# Patient Record
Sex: Female | Born: 1960 | Race: White | Hispanic: No | Marital: Married | Smoking: Never smoker
Health system: Southern US, Community
[De-identification: ages and names within clinical notes are randomized; demographics above are authoritative.]

## PROBLEM LIST (undated history)

## (undated) DIAGNOSIS — M1990 Unspecified osteoarthritis, unspecified site: Secondary | ICD-10-CM

## (undated) DIAGNOSIS — F32A Depression, unspecified: Secondary | ICD-10-CM

## (undated) DIAGNOSIS — F329 Major depressive disorder, single episode, unspecified: Secondary | ICD-10-CM

## (undated) DIAGNOSIS — J309 Allergic rhinitis, unspecified: Secondary | ICD-10-CM

## (undated) DIAGNOSIS — T7840XA Allergy, unspecified, initial encounter: Secondary | ICD-10-CM

## (undated) DIAGNOSIS — F419 Anxiety disorder, unspecified: Secondary | ICD-10-CM

## (undated) DIAGNOSIS — M6281 Muscle weakness (generalized): Secondary | ICD-10-CM

## (undated) DIAGNOSIS — J302 Other seasonal allergic rhinitis: Secondary | ICD-10-CM

## (undated) DIAGNOSIS — R519 Headache, unspecified: Secondary | ICD-10-CM

## (undated) DIAGNOSIS — I10 Essential (primary) hypertension: Secondary | ICD-10-CM

## (undated) DIAGNOSIS — G4700 Insomnia, unspecified: Secondary | ICD-10-CM

## (undated) DIAGNOSIS — K449 Diaphragmatic hernia without obstruction or gangrene: Secondary | ICD-10-CM

## (undated) DIAGNOSIS — D649 Anemia, unspecified: Secondary | ICD-10-CM

## (undated) DIAGNOSIS — K219 Gastro-esophageal reflux disease without esophagitis: Secondary | ICD-10-CM

## (undated) DIAGNOSIS — K224 Dyskinesia of esophagus: Secondary | ICD-10-CM

## (undated) DIAGNOSIS — D131 Benign neoplasm of stomach: Secondary | ICD-10-CM

## (undated) HISTORY — DX: Essential (primary) hypertension: I10

## (undated) HISTORY — DX: Allergic rhinitis, unspecified: J30.9

## (undated) HISTORY — DX: Allergy, unspecified, initial encounter: T78.40XA

## (undated) HISTORY — DX: Dyskinesia of esophagus: K22.4

## (undated) HISTORY — DX: Anxiety disorder, unspecified: F41.9

## (undated) HISTORY — DX: Unspecified osteoarthritis, unspecified site: M19.90

## (undated) HISTORY — PX: WISDOM TOOTH EXTRACTION: SHX21

## (undated) HISTORY — DX: Diaphragmatic hernia without obstruction or gangrene: K44.9

## (undated) HISTORY — PX: UPPER GI ENDOSCOPY: SHX6162

## (undated) HISTORY — DX: Muscle weakness (generalized): M62.81

## (undated) HISTORY — PX: UPPER GASTROINTESTINAL ENDOSCOPY: SHX188

## (undated) HISTORY — DX: Gastro-esophageal reflux disease without esophagitis: K21.9

## (undated) HISTORY — PX: COLONOSCOPY: SHX174

---

## 1999-01-22 ENCOUNTER — Other Ambulatory Visit: Admission: RE | Admit: 1999-01-22 | Discharge: 1999-01-22 | Payer: Self-pay | Admitting: Obstetrics & Gynecology

## 2000-01-20 ENCOUNTER — Other Ambulatory Visit: Admission: RE | Admit: 2000-01-20 | Discharge: 2000-01-20 | Payer: Self-pay | Admitting: Gynecology

## 2002-03-06 ENCOUNTER — Other Ambulatory Visit: Admission: RE | Admit: 2002-03-06 | Discharge: 2002-03-06 | Payer: Self-pay | Admitting: Gynecology

## 2003-03-08 ENCOUNTER — Other Ambulatory Visit: Admission: RE | Admit: 2003-03-08 | Discharge: 2003-03-08 | Payer: Self-pay | Admitting: Gynecology

## 2003-06-04 ENCOUNTER — Ambulatory Visit (HOSPITAL_COMMUNITY): Admission: RE | Admit: 2003-06-04 | Discharge: 2003-06-04 | Payer: Self-pay | Admitting: Pulmonary Disease

## 2005-04-23 ENCOUNTER — Other Ambulatory Visit: Admission: RE | Admit: 2005-04-23 | Discharge: 2005-04-23 | Payer: Self-pay | Admitting: Gynecology

## 2011-05-15 ENCOUNTER — Ambulatory Visit (INDEPENDENT_AMBULATORY_CARE_PROVIDER_SITE_OTHER): Payer: 59 | Admitting: Gastroenterology

## 2011-05-15 ENCOUNTER — Encounter: Payer: Self-pay | Admitting: Gastroenterology

## 2011-05-15 VITALS — BP 130/90 | HR 68 | Ht 62.0 in | Wt 138.4 lb

## 2011-05-15 DIAGNOSIS — I7300 Raynaud's syndrome without gangrene: Secondary | ICD-10-CM | POA: Insufficient documentation

## 2011-05-15 DIAGNOSIS — R072 Precordial pain: Secondary | ICD-10-CM

## 2011-05-15 DIAGNOSIS — K219 Gastro-esophageal reflux disease without esophagitis: Secondary | ICD-10-CM | POA: Insufficient documentation

## 2011-05-15 DIAGNOSIS — R0789 Other chest pain: Secondary | ICD-10-CM | POA: Insufficient documentation

## 2011-05-15 MED ORDER — DEXLANSOPRAZOLE 60 MG PO CPDR: 60.0000 mg | DELAYED_RELEASE_CAPSULE | Freq: Every day | ORAL | Status: DC

## 2011-05-15 NOTE — Patient Instructions (Addendum)
Your procedure has been scheduled for 05/20/2011, please follow the seperate instructions.  Your prescription(s) have been sent to you pharmacy.  Today you watched the [employer].       Gastroesophageal Reflux Disease (GERD) Your caregiver has diagnosed your chest discomfort as caused by gastroesophageal reflux disease (GERD). GERD is caused by a reflux of acid from your stomach into the digestive tube between your mouth and stomach (esophagus). Acid in contact with the esophagus causes soreness (inflammation) resulting in heartburn or chest pain. It may cause small holes in the lining of the esophagus (ulcers). CAUSES  Increased body weight puts pressure on the stomach, making acid rise.   Smoking increases acid production.   Alcohol decreases pressure on the valve between the stomach and esophagus (lower esophageal sphincter), allowing acid from the stomach into the esophagus.   Late evening meals and a full stomach increase pressure and acid production.   Lower esophageal sphincter is malformed.   Sometimes, no reason is found.  HOME CARE INSTRUCTIONS  Change the factors that you can control. Weight, smoking, or alcohol changes may be difficult to change on your own. Your caregiver can provide guidance and medical therapy.   Raising the head of your bed may help you to sleep.   Over-the-counter medicines will decrease acid production. Your caregiver can also prescribe medicines for this. Only take over-the-counter or prescription medicines for pain, discomfort, or fever as directed by your caregiver.   1/2 to 1 teaspoon of an antacid taken every hour while awake, with meals, and at bedtime, can neutralize acid.   DO NOT take aspirin, ibuprofen, or other nonsteroidal anti-inflammatory drugs (NSAIDs).  SEEK IMMEDIATE MEDICAL CARE IF:  The pain changes in location (radiates into arms, neck, jaw, teeth, or back), intensity, or duration.   You start feeling sick to your stomach  (nauseous), start throwing up (vomiting), or sweating (diaphoresis).   You develop left arm or jaw pain.   You develop pain going into your back, shortness of breath, or you pass out.   There is vomiting of fluid that is green, yellow, or looks like coffee grounds or blood.  These symptoms could signal other problems, such as heart disease. MAKE SURE YOU:  Understand these instructions.   Will watch your condition.   Will get help right away if you are not doing well or get worse.  Document Released: 09/09/2005 Document Re-Released: 02/24/2010 [facility] Patient Information 2011 [place], [place].

## 2011-05-15 NOTE — Progress Notes (Signed)
History of Present Illness:  This is a extremely 50 year old Caucasian female referred for evaluation of years of reflux symptoms consisting of regurgitation and burning substernal chest pain, worse in the morning, and partially relieved by over-the-counter Prilosec. She has a globus sensation her throat, but denies true dysphagia. She has voluntarily lost 20 pounds in weight over the last year with some improvement in her chronic symptoms. She denies hepatobiliary complaints, change in bowel habits, melena or hematochezia. She otherwise is healthy, but does have Raynaud's phenomenon in her hands and feet. She is on birth control pills, does not have menstrual periods, but is followed closely by her gynecologist. She has not had previous endoscopy or barium x-rays. Family history is remarkable for chronic acid reflux in her mother and brother.  I have reviewed this patient's present history, medical and surgical past history, allergies and medications.     ROS: The remainder of the 10 point ROS is negative... she does have hormonal induced headaches, allergic sinusitis, and chronic insomnia. She denies any cardiovascular pulmonary or genitourinary symptoms. She has not had previous surgical procedures.   Past Medical History  Diagnosis Date  . Chronic headaches    Past Surgical History  Procedure Date  . Wisdom tooth extraction     reports that she has never smoked. She has never used smokeless tobacco. She reports that she does not drink alcohol or use illicit drugs. family history includes Hypertension in her mother and Thyroid cancer in her mother. No Known Allergies     Physical Exam: General well developed well nourished patient in no acute distress, appearing her stated age Eyes PERRLA, no icterus, fundoscopic exam per opthamologist Skin no lesions noted Neck supple, no adenopathy, no thyroid enlargement, no tenderness. Examination of the oropharynx also is unremarkable. Chest clear  to percussion and auscultation Heart no significant murmurs, gallops or rubs noted Abdomen no hepatosplenomegaly masses or tenderness, BS normal.  Extremities no acute joint lesions, edema, phlebitis or evidence of cellulitis. She does have very cold digits but no evidence of cyanosis at this time. Neurologic patient oriented x 3, cranial nerves intact, no focal neurologic deficits noted. Psychological mental status normal and normal affect.  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 I have changed her to Dexilant 60 mg 30 minutes before the evening meal with standard anti-reflex maneuvers. She saw our patient video acid reflux management. She is to eat 3 meals a day but to avoid food within 2 hours of bedtime. Outpatient endoscopy scheduled to exclude Barrett's mucosa. She may need esophageal manometry to confirm possible motility disorder dependent on her clinical course and management. \ Please copy her primary care physician, referring physician, and pertinent subspecialists.  Encounter Diagnosis  Name Primary?  . Esophageal reflux Yes

## 2011-05-20 ENCOUNTER — Telehealth: Payer: Self-pay | Admitting: *Deleted

## 2011-05-20 ENCOUNTER — Ambulatory Visit (AMBULATORY_SURGERY_CENTER): Payer: 59 | Admitting: Gastroenterology

## 2011-05-20 ENCOUNTER — Encounter: Payer: Self-pay | Admitting: Gastroenterology

## 2011-05-20 VITALS — BP 147/79 | HR 74 | Temp 99.1°F | Resp 18 | Ht 62.0 in | Wt 138.0 lb

## 2011-05-20 DIAGNOSIS — K219 Gastro-esophageal reflux disease without esophagitis: Secondary | ICD-10-CM

## 2011-05-20 MED ORDER — SODIUM CHLORIDE 0.9 % IV SOLN: 500.0000 mL | INTRAVENOUS | Status: DC

## 2011-05-20 NOTE — Patient Instructions (Signed)
Discharge instructions given with verbal understanding. Handout on hiatal hernia and gerd given. Soft diet given. Resume previous medications.

## 2011-05-21 ENCOUNTER — Encounter: Payer: Self-pay | Admitting: *Deleted

## 2011-05-21 ENCOUNTER — Telehealth: Payer: Self-pay

## 2011-05-21 NOTE — Telephone Encounter (Signed)
Lmom for pt explaining I scheduled her for a f/u with Dr Jarold Motto for 06/22/11 at 0845am. I will send her a letter informing her of this.

## 2011-05-21 NOTE — Telephone Encounter (Signed)
No ID on answering machine. 

## 2011-06-10 ENCOUNTER — Encounter: Payer: Self-pay | Admitting: *Deleted

## 2011-06-22 ENCOUNTER — Ambulatory Visit (INDEPENDENT_AMBULATORY_CARE_PROVIDER_SITE_OTHER): Payer: 59 | Admitting: Gastroenterology

## 2011-06-22 VITALS — BP 118/76 | HR 88 | Ht 62.0 in | Wt 140.0 lb

## 2011-06-22 DIAGNOSIS — K219 Gastro-esophageal reflux disease without esophagitis: Secondary | ICD-10-CM

## 2011-06-22 MED ORDER — PANTOPRAZOLE SODIUM 40 MG PO TBEC: 40.0000 mg | DELAYED_RELEASE_TABLET | Freq: Every day | ORAL | Status: DC

## 2011-06-22 NOTE — Patient Instructions (Addendum)
We have sent in Protonix to your pharmacy, per our computer it is the only reflux medication that is covered by your insurance.  Buy Caltrate 600 with Vit D OTC and take one by mouth twice a day

## 2011-06-22 NOTE — Progress Notes (Signed)
History of Present Illness: This is a 50 year old Caucasian female with Raynaud's phenomenon and chronic GERD. Recent endoscopy was otherwise unremarkable. She is currently asymptomatic on Dexilant 60 mg a day and standard reflux maneuvers. There is no history of other gastrointestinal or general medical problems. She is currently being evaluated by GYN for menopausal symptoms.  Current Medications, Allergies, Past Medical History, Past Surgical History, Family History and Social History were reviewed in [employer] record.   Assessment and plan: Doing well all her current regime. I prescribed a cheaper PPI,and we will see how she does symptomatically. When necessary followup suggested. She will need colonoscopy at age 66. I suggested Caltrate 600 with vitamin D, 2 a day for prophylaxis against osteoporosis with long-term PPI use.  Please copy her primary care physician, referring physician, and pertinent subspecialists.

## 2012-02-08 ENCOUNTER — Telehealth: Payer: Self-pay | Admitting: Gastroenterology

## 2012-02-09 MED ORDER — DEXLANSOPRAZOLE 60 MG PO CPDR: DELAYED_RELEASE_CAPSULE | ORAL | Status: DC

## 2012-02-09 NOTE — Telephone Encounter (Signed)
Pt with hx of Chronic GERD, Raynaud's; last OV 06/22/11. She was placed on Dexilant, but it was too expensive and she switched to Pantoprazole. Today, she reports the Protonix isn't strong enough and wants another PPI. She requested Dexilant and it was ordered

## 2012-05-24 ENCOUNTER — Other Ambulatory Visit (HOSPITAL_COMMUNITY): Payer: Self-pay | Admitting: Family Medicine

## 2012-05-24 DIAGNOSIS — Z139 Encounter for screening, unspecified: Secondary | ICD-10-CM

## 2012-05-27 ENCOUNTER — Ambulatory Visit (HOSPITAL_COMMUNITY)
Admission: RE | Admit: 2012-05-27 | Discharge: 2012-05-27 | Disposition: A | Discharge status: Home / Self Care | Payer: 59 | Source: Ambulatory Visit | Attending: Family Medicine | Admitting: Family Medicine

## 2012-05-27 DIAGNOSIS — Z13820 Encounter for screening for osteoporosis: Secondary | ICD-10-CM | POA: Insufficient documentation

## 2012-05-27 DIAGNOSIS — Z139 Encounter for screening, unspecified: Secondary | ICD-10-CM

## 2012-07-22 ENCOUNTER — Other Ambulatory Visit: Payer: Self-pay | Admitting: Obstetrics and Gynecology

## 2012-07-22 DIAGNOSIS — Z1231 Encounter for screening mammogram for malignant neoplasm of breast: Secondary | ICD-10-CM

## 2012-08-17 ENCOUNTER — Ambulatory Visit
Admission: RE | Admit: 2012-08-17 | Discharge: 2012-08-17 | Disposition: A | Discharge status: Home / Self Care | Payer: 59 | Source: Ambulatory Visit | Attending: Obstetrics and Gynecology | Admitting: Obstetrics and Gynecology

## 2012-08-17 DIAGNOSIS — Z1231 Encounter for screening mammogram for malignant neoplasm of breast: Secondary | ICD-10-CM

## 2012-09-28 ENCOUNTER — Other Ambulatory Visit: Payer: Self-pay | Admitting: Gastroenterology

## 2012-12-29 ENCOUNTER — Encounter: Payer: Self-pay | Admitting: Gastroenterology

## 2013-01-04 ENCOUNTER — Ambulatory Visit (AMBULATORY_SURGERY_CENTER): Payer: 59 | Admitting: *Deleted

## 2013-01-04 ENCOUNTER — Encounter: Payer: Self-pay | Admitting: Gastroenterology

## 2013-01-04 VITALS — Ht 62.0 in | Wt 143.0 lb

## 2013-01-04 DIAGNOSIS — Z1211 Encounter for screening for malignant neoplasm of colon: Secondary | ICD-10-CM

## 2013-01-04 MED ORDER — MOVIPREP 100 G PO SOLR: ORAL | Status: DC

## 2013-01-23 ENCOUNTER — Encounter: Payer: Self-pay | Admitting: Gastroenterology

## 2013-01-23 ENCOUNTER — Ambulatory Visit (AMBULATORY_SURGERY_CENTER): Payer: 59 | Admitting: Gastroenterology

## 2013-01-23 VITALS — BP 142/73 | HR 62 | Temp 98.5°F | Resp 15 | Ht 62.0 in | Wt 143.0 lb

## 2013-01-23 DIAGNOSIS — Z1211 Encounter for screening for malignant neoplasm of colon: Secondary | ICD-10-CM

## 2013-01-23 MED ORDER — SODIUM CHLORIDE 0.9 % IV SOLN: 500.0000 mL | INTRAVENOUS | Status: DC

## 2013-01-23 NOTE — Patient Instructions (Addendum)
YOU HAD AN ENDOSCOPIC PROCEDURE TODAY AT THE  ENDOSCOPY CENTER: Refer to the procedure report that was given to you for any specific questions about what was found during the examination.  If the procedure report does not answer your questions, please call your gastroenterologist to clarify.  If you requested that your care partner not be given the details of your procedure findings, then the procedure report has been included in a sealed envelope for you to review at your convenience later.  YOU SHOULD EXPECT: Some feelings of bloating in the abdomen. Passage of more gas than usual.  Walking can help get rid of the air that was put into your GI tract during the procedure and reduce the bloating. If you had a lower endoscopy (such as a colonoscopy or flexible sigmoidoscopy) you may notice spotting of blood in your stool or on the toilet paper. If you underwent a bowel prep for your procedure, then you may not have a normal bowel movement for a few days.  DIET: Your first meal following the procedure should be a light meal and then it is ok to progress to your normal diet.  A half-sandwich or bowl of soup is an example of a good first meal.  Heavy or fried foods are harder to digest and may make you feel nauseous or bloated.  Likewise meals heavy in dairy and vegetables can cause extra gas to form and this can also increase the bloating.  Drink plenty of fluids but you should avoid alcoholic beverages for 24 hours.  Use a tablespoon of metamucil every morning after breakfast per Dr. Jarold Motto.  ACTIVITY: Your care partner should take you home directly after the procedure.  You should plan to take it easy, moving slowly for the rest of the day.  You can resume normal activity the day after the procedure however you should NOT DRIVE or use heavy machinery for 24 hours (because of the sedation medicines used during the test).    SYMPTOMS TO REPORT IMMEDIATELY: A gastroenterologist can be reached at any  hour.  During normal business hours, 8:30 AM to 5:00 PM Monday through Friday, call [phone].  After hours and on weekends, please call the GI answering service at [phone] who will take a message and have the physician on call contact you.   Following lower endoscopy (colonoscopy or flexible sigmoidoscopy):  Excessive amounts of blood in the stool  Significant tenderness or worsening of abdominal pains  Swelling of the abdomen that is new, acute  Fever of 100F or higher  FOLLOW UP: If any biopsies were taken you will be contacted by phone or by letter within the next 1-3 weeks.  Call your gastroenterologist if you have not heard about the biopsies in 3 weeks.  Our staff will call the home number listed on your records the next business day following your procedure to check on you and address any questions or concerns that you may have at that time regarding the information given to you following your procedure. This is a courtesy call and so if there is no answer at the home number and we have not heard from you through the emergency physician on call, we will assume that you have returned to your regular daily activities without incident.  SIGNATURES/CONFIDENTIALITY: You and/or your care partner have signed paperwork which will be entered into your electronic medical record.  These signatures attest to the fact that that the information above on your After Visit Summary has  been reviewed and is understood.  Full responsibility of the confidentiality of this discharge information lies with you and/or your care-partner.  Thank-you for choosing [place] for your healthcare needs.

## 2013-01-23 NOTE — Op Note (Signed)
[facility] [address] [place], [address]   COLONOSCOPY PROCEDURE REPORT  PATIENT: Carla Massey, Carla Massey  MR#: [id number] BIRTHDATE: [birth year] , 52  yrs. old GENDER: Female ENDOSCOPIST: Mardella Layman, MD, Clementeen Graham REFERRED BY:  Assunta Found, M.D. PROCEDURE DATE:  01/23/2013 PROCEDURE:   Colonoscopy, screening ASA CLASS:   Class II INDICATIONS:Average risk patient for colon cancer. MEDICATIONS: propofol (Diprivan) 200mg  IV  DESCRIPTION OF PROCEDURE:   After the risks and benefits and of the procedure were explained, informed consent was obtained.  A digital rectal exam revealed no abnormalities of the rectum.    The LB [id number]  endoscope was introduced through the anus and advanced to the cecum, which was identified by both the appendix and ileocecal valve .  The quality of the prep was good, using MoviPrep .  The instrument was then slowly withdrawn as the colon was fully examined.     COLON FINDINGS: A normal appearing cecum, ileocecal valve, and appendiceal orifice were identified.  The ascending, hepatic flexure, transverse, splenic flexure, descending, sigmoid colon and rectum appeared unremarkable.  No polyps or cancers were seen. Retroflexed views revealed no abnormalities.     The scope was then withdrawn from the patient and the procedure completed.  COMPLICATIONS: There were no complications. ENDOSCOPIC IMPRESSION: Normal colon ...no polyps noted...very redindant colon noted  RECOMMENDATIONS: 1.  High fiber diet with liberal fluid intake. 2.  Continue current colorectal screening recommendations for "routine risk" patients with a repeat colonoscopy in 10 years.   REPEAT EXAM:  cc:  _______________________________ eSignedMardella Layman, MD, [facility] 01/23/2013 8:50 AM

## 2013-01-23 NOTE — Progress Notes (Addendum)
Patient did not have preoperative order for IV antibiotic SSI prophylaxis. (G8918)  Patient did not experience any of the following events: a burn prior to discharge; a fall within the facility; wrong site/side/patient/procedure/implant event; or a hospital transfer or hospital admission upon discharge from the facility. (G8907)  

## 2013-01-24 ENCOUNTER — Telehealth: Payer: Self-pay | Admitting: *Deleted

## 2013-01-24 NOTE — Telephone Encounter (Signed)
Number identifier, left message, follow-up  

## 2013-05-22 ENCOUNTER — Other Ambulatory Visit: Payer: Self-pay

## 2013-05-22 DIAGNOSIS — Z1231 Encounter for screening mammogram for malignant neoplasm of breast: Secondary | ICD-10-CM

## 2013-07-12 ENCOUNTER — Other Ambulatory Visit: Payer: Self-pay | Admitting: Obstetrics and Gynecology

## 2013-08-23 ENCOUNTER — Ambulatory Visit: Payer: 59

## 2013-08-30 ENCOUNTER — Ambulatory Visit: Payer: 59

## 2013-09-06 ENCOUNTER — Ambulatory Visit
Admission: RE | Admit: 2013-09-06 | Discharge: 2013-09-06 | Disposition: A | Discharge status: Home / Self Care | Payer: Self-pay | Source: Ambulatory Visit

## 2013-09-06 DIAGNOSIS — Z1231 Encounter for screening mammogram for malignant neoplasm of breast: Secondary | ICD-10-CM

## 2014-01-15 ENCOUNTER — Other Ambulatory Visit: Payer: Self-pay | Admitting: Gastroenterology

## 2014-04-24 ENCOUNTER — Encounter: Payer: Self-pay | Admitting: Internal Medicine

## 2014-05-02 ENCOUNTER — Ambulatory Visit (INDEPENDENT_AMBULATORY_CARE_PROVIDER_SITE_OTHER): Payer: 59 | Admitting: Internal Medicine

## 2014-05-02 ENCOUNTER — Encounter: Payer: Self-pay | Admitting: Internal Medicine

## 2014-05-02 VITALS — BP 122/70 | HR 76 | Ht 61.5 in | Wt 144.0 lb

## 2014-05-02 DIAGNOSIS — K219 Gastro-esophageal reflux disease without esophagitis: Secondary | ICD-10-CM

## 2014-05-02 MED ORDER — OMEPRAZOLE-SODIUM BICARBONATE 40-1100 MG PO CAPS: 1.0000 | ORAL_CAPSULE | Freq: Every day | ORAL | Status: DC

## 2014-05-02 NOTE — Progress Notes (Signed)
         Subjective:    Patient ID: Carla Massey, female    DOB: 1961-02-23, 53 y.o.   MRN: [id number]  HPI  Very nice middle-aged lady formerly seen by Dr. Verl Blalock. GERD dx and ? Dysmotility on EGD 2012. Esophageal mucosa was ok. Pantoprazole not helpful  Dexilant - made her feel "hungry" but worked Ran out of that and is using OTC zegerid helps but not quite relief (complete).  Medications, allergies, past medical history, past surgical history, family history and social history are reviewed and updated in the EMR.  Review of Systems As above    Objective:   Physical Exam General:  NAD Eyes:   anicteric Lungs:  clear Heart:  S1S2 no rubs, murmurs or gallops   Data Reviewed:   Prior EGD       Assessment & Plan:  GERD (gastroesophageal reflux disease)  ? Motility d/o  1) Zegerid 40 mg qd 2) call back or f/u prn 3) refill through PCP if she can and is ok 4) discussed possible bone loss issue - DEXA ok 5) if more problems    I appreciate the opportunity to care for you.  OI:[id number], Betsy Coder, MD

## 2014-05-02 NOTE — Patient Instructions (Signed)
We have sent the following medications to your pharmacy for you to pick up at your convenience: Zegerid  Follow up as needed with [place].   I appreciate the opportunity to care for you.

## 2014-05-03 ENCOUNTER — Telehealth: Payer: Self-pay

## 2014-05-03 MED ORDER — OMEPRAZOLE-SODIUM BICARBONATE 40-1680 MG PO PACK: 1.0000 | PACK | Freq: Every day | ORAL | Status: DC

## 2014-05-03 NOTE — Telephone Encounter (Signed)
Do the packets at 40 mg Explain tos patient will be a powder

## 2014-05-03 NOTE — Telephone Encounter (Signed)
Spoke to patient and informed her of the change in her zegerid rx from capsules to powder.  Rx sent to [facility] in [place].

## 2014-05-03 NOTE — Telephone Encounter (Signed)
Spoke with Constance Holster at [employer] [phone], pt ID# [id number].  Zegerid 40-1100mg  is a plan exclusion.  They cover zegerid powder paks 20- 40-1680mg  per packet and they cover omeprazole 40mg .  Please advise Sir.  Thank you.

## 2014-05-04 ENCOUNTER — Telehealth: Payer: Self-pay | Admitting: Internal Medicine

## 2014-05-04 NOTE — Telephone Encounter (Signed)
Left her a message to call me back to see if she went to get the powder Zegerid sent in most recent.

## 2014-05-04 NOTE — Telephone Encounter (Signed)
Patient did not pick up the powder, cost $60.00.  She would like to know what you suggest Sir.  She is taking OTC PPI until we get back to her.  Thank you.

## 2014-05-09 ENCOUNTER — Telehealth: Payer: Self-pay | Admitting: Internal Medicine

## 2014-05-09 NOTE — Telephone Encounter (Signed)
Spoke with patient, she is going to call insurance company back and request list of PPI's that they cover.  She will let us know so we can send one in that Dr.Gessner approves.

## 2014-05-09 NOTE — Telephone Encounter (Signed)
Her insurance will cover the Pantoprazole and Omeprazole Sir.  Which one would you like for her to try since zegerid too expensive.  Thank you for advising.

## 2014-05-10 MED ORDER — OMEPRAZOLE 40 MG PO CPDR: 40.0000 mg | DELAYED_RELEASE_CAPSULE | Freq: Every day | ORAL | Status: DC

## 2014-05-10 NOTE — Telephone Encounter (Signed)
Try omeprazole 40 mg every AM 3- mins before breakfast  # [phone] refills  Sorry for delay

## 2014-08-02 ENCOUNTER — Other Ambulatory Visit: Payer: Self-pay

## 2014-08-02 DIAGNOSIS — Z1231 Encounter for screening mammogram for malignant neoplasm of breast: Secondary | ICD-10-CM

## 2014-09-12 ENCOUNTER — Ambulatory Visit: Payer: 59

## 2014-09-26 ENCOUNTER — Ambulatory Visit
Admission: RE | Admit: 2014-09-26 | Discharge: 2014-09-26 | Disposition: A | Discharge status: Home / Self Care | Payer: 59 | Source: Ambulatory Visit

## 2014-09-26 ENCOUNTER — Encounter (INDEPENDENT_AMBULATORY_CARE_PROVIDER_SITE_OTHER): Payer: Self-pay

## 2014-09-26 DIAGNOSIS — Z1231 Encounter for screening mammogram for malignant neoplasm of breast: Secondary | ICD-10-CM

## 2015-05-14 ENCOUNTER — Other Ambulatory Visit: Payer: Self-pay | Admitting: Internal Medicine

## 2015-05-14 NOTE — Patient Instructions (Addendum)
   Your procedure is scheduled on: Tuesday, June 7  Enter through the [employer] [facility] at: 6 AM Pick up the phone at the desk and dial [phone] and inform us of your arrival.  Please call this number if you have any problems the morning of surgery: [phone]  Remember: Do not eat or drink after midnight: Monday Take these medicines the morning of surgery with a SIP OF WATER:  Prilosec  Do not wear jewelry, make-up, or FINGER nail polish No metal in your hair or on your body. Do not wear lotions, powders, perfumes.  You may wear deodorant.  Do not bring valuables to the hospital. Contacts, dentures or bridgework may not be worn into surgery.  Leave suitcase in the car. After Surgery it may be brought to your room. For patients being admitted to the hospital, checkout time is 11:00am the day of discharge.  Home with husband Alvester Chou cell [phone]

## 2015-05-15 ENCOUNTER — Encounter (HOSPITAL_COMMUNITY)
Admission: RE | Admit: 2015-05-15 | Discharge: 2015-05-15 | Disposition: A | Discharge status: Home / Self Care | Payer: 59 | Source: Ambulatory Visit | Attending: Obstetrics and Gynecology | Admitting: Obstetrics and Gynecology

## 2015-05-15 ENCOUNTER — Encounter (HOSPITAL_COMMUNITY): Payer: Self-pay

## 2015-05-15 DIAGNOSIS — Z01818 Encounter for other preprocedural examination: Secondary | ICD-10-CM | POA: Diagnosis present

## 2015-05-15 DIAGNOSIS — D259 Leiomyoma of uterus, unspecified: Secondary | ICD-10-CM | POA: Diagnosis not present

## 2015-05-15 HISTORY — DX: Major depressive disorder, single episode, unspecified: F32.9

## 2015-05-15 HISTORY — DX: Insomnia, unspecified: G47.00

## 2015-05-15 HISTORY — DX: Anemia, unspecified: D64.9

## 2015-05-15 HISTORY — DX: Depression, unspecified: F32.A

## 2015-05-15 HISTORY — DX: Other seasonal allergic rhinitis: J30.2

## 2015-05-15 LAB — CBC
HCT: 32.4 % — ABNORMAL LOW (ref 36.0–46.0)
Hemoglobin: 10.1 g/dL — ABNORMAL LOW (ref 12.0–15.0)
MCH: 23.9 pg — ABNORMAL LOW (ref 26.0–34.0)
MCHC: 31.2 g/dL (ref 30.0–36.0)
MCV: 76.6 fL — AB (ref 78.0–100.0)
Platelets: 341 10*3/uL (ref 150–400)
RBC: 4.23 MIL/uL (ref 3.87–5.11)
RDW: 17 % — ABNORMAL HIGH (ref 11.5–15.5)
WBC: 6.2 10*3/uL (ref 4.0–10.5)

## 2015-05-18 NOTE — Anesthesia Preprocedure Evaluation (Addendum)
Anesthesia Evaluation  Patient identified by MRN, date of birth, ID band Patient awake    Reviewed: Allergy & Precautions, NPO status , Patient's Chart, lab work & pertinent test results, reviewed documented beta blocker date and time   Airway Mallampati: II   Neck ROM: Full    Dental  (+) Teeth Intact, Dental Advisory Given   Pulmonary neg pulmonary ROS,  breath sounds clear to auscultation        Cardiovascular negative cardio ROS  Rhythm:Regular     Neuro/Psych Depression    GI/Hepatic Neg liver ROS, hiatal hernia, GERD-  Medicated,  Endo/Other  negative endocrine ROS  Renal/GU negative Renal ROS     Musculoskeletal   Abdominal (+)  Abdomen: soft.    Peds  Hematology  (+) anemia , 10/32   Anesthesia Other Findings   Reproductive/Obstetrics                            Anesthesia Physical Anesthesia Plan  ASA: II  Anesthesia Plan: General   Post-op Pain Management:    Induction: Intravenous  Airway Management Planned: Oral ETT  Additional Equipment:   Intra-op Plan:   Post-operative Plan: Extubation in OR  Informed Consent: I have reviewed the patients History and Physical, chart, labs and discussed the procedure including the risks, benefits and alternatives for the proposed anesthesia with the patient or authorized representative who has indicated his/her understanding and acceptance.     Plan Discussed with:   Anesthesia Plan Comments:         Anesthesia Quick Evaluation

## 2015-05-21 ENCOUNTER — Encounter (HOSPITAL_COMMUNITY)
Admission: RE | Disposition: A | Discharge status: Home / Self Care | Payer: Self-pay | Source: Ambulatory Visit | Attending: Obstetrics and Gynecology

## 2015-05-21 ENCOUNTER — Observation Stay (HOSPITAL_COMMUNITY)
Admission: RE | Admit: 2015-05-21 | Discharge: 2015-05-22 | Disposition: A | Discharge status: Home / Self Care | Payer: 59 | Source: Ambulatory Visit | Attending: Obstetrics and Gynecology | Admitting: Obstetrics and Gynecology

## 2015-05-21 ENCOUNTER — Encounter (HOSPITAL_COMMUNITY): Payer: Self-pay

## 2015-05-21 ENCOUNTER — Ambulatory Visit (HOSPITAL_COMMUNITY): Payer: 59 | Admitting: Anesthesiology

## 2015-05-21 DIAGNOSIS — F329 Major depressive disorder, single episode, unspecified: Secondary | ICD-10-CM | POA: Insufficient documentation

## 2015-05-21 DIAGNOSIS — G4700 Insomnia, unspecified: Secondary | ICD-10-CM | POA: Insufficient documentation

## 2015-05-21 DIAGNOSIS — D259 Leiomyoma of uterus, unspecified: Secondary | ICD-10-CM | POA: Insufficient documentation

## 2015-05-21 DIAGNOSIS — K449 Diaphragmatic hernia without obstruction or gangrene: Secondary | ICD-10-CM | POA: Insufficient documentation

## 2015-05-21 DIAGNOSIS — N920 Excessive and frequent menstruation with regular cycle: Principal | ICD-10-CM | POA: Insufficient documentation

## 2015-05-21 DIAGNOSIS — K219 Gastro-esophageal reflux disease without esophagitis: Secondary | ICD-10-CM | POA: Diagnosis not present

## 2015-05-21 DIAGNOSIS — Z888 Allergy status to other drugs, medicaments and biological substances status: Secondary | ICD-10-CM | POA: Insufficient documentation

## 2015-05-21 DIAGNOSIS — Z79899 Other long term (current) drug therapy: Secondary | ICD-10-CM | POA: Diagnosis not present

## 2015-05-21 DIAGNOSIS — Z90710 Acquired absence of both cervix and uterus: Secondary | ICD-10-CM | POA: Diagnosis present

## 2015-05-21 DIAGNOSIS — D649 Anemia, unspecified: Secondary | ICD-10-CM | POA: Insufficient documentation

## 2015-05-21 DIAGNOSIS — J309 Allergic rhinitis, unspecified: Secondary | ICD-10-CM | POA: Diagnosis not present

## 2015-05-21 DIAGNOSIS — N838 Other noninflammatory disorders of ovary, fallopian tube and broad ligament: Secondary | ICD-10-CM | POA: Diagnosis not present

## 2015-05-21 HISTORY — PX: LAPAROSCOPIC ASSISTED VAGINAL HYSTERECTOMY: SHX5398

## 2015-05-21 HISTORY — PX: BILATERAL SALPINGECTOMY: SHX5743

## 2015-05-21 LAB — TYPE AND SCREEN
ABO/RH(D): A POS
ANTIBODY SCREEN: NEGATIVE

## 2015-05-21 LAB — ABO/RH: ABO/RH(D): A POS

## 2015-05-21 LAB — PREGNANCY, URINE: PREG TEST UR: NEGATIVE

## 2015-05-21 SURGERY — HYSTERECTOMY, VAGINAL, LAPAROSCOPY-ASSISTED
Anesthesia: General | Site: Abdomen

## 2015-05-21 MED ORDER — KETOROLAC TROMETHAMINE 30 MG/ML IJ SOLN
INTRAMUSCULAR | Status: AC
  Filled 2015-05-21: qty 1

## 2015-05-21 MED ORDER — LACTATED RINGERS IV SOLN
INTRAVENOUS | Status: DC
  Administered 2015-05-21 (×3): via INTRAVENOUS

## 2015-05-21 MED ORDER — ONDANSETRON HCL 4 MG/2ML IJ SOLN
INTRAMUSCULAR | Status: AC
  Filled 2015-05-21: qty 2

## 2015-05-21 MED ORDER — SENNA 8.6 MG PO TABS
1.0000 | ORAL_TABLET | Freq: Two times a day (BID) | ORAL | Status: DC
  Administered 2015-05-21 – 2015-05-22 (×2): 8.6 mg via ORAL
  Filled 2015-05-21 (×3): qty 1

## 2015-05-21 MED ORDER — DIPHENHYDRAMINE HCL 50 MG/ML IJ SOLN: 12.5000 mg | Freq: Four times a day (QID) | INTRAMUSCULAR | Status: DC | PRN

## 2015-05-21 MED ORDER — MEPERIDINE HCL 25 MG/ML IJ SOLN: 6.2500 mg | INTRAMUSCULAR | Status: DC | PRN

## 2015-05-21 MED ORDER — FENTANYL CITRATE (PF) 250 MCG/5ML IJ SOLN
INTRAMUSCULAR | Status: AC
Start: 2015-05-21 — End: 2015-05-21
  Filled 2015-05-21: qty 5

## 2015-05-21 MED ORDER — FENTANYL CITRATE (PF) 250 MCG/5ML IJ SOLN
INTRAMUSCULAR | Status: AC
  Filled 2015-05-21: qty 5

## 2015-05-21 MED ORDER — AZELASTINE HCL 0.1 % NA SOLN: 1.0000 | Freq: Two times a day (BID) | NASAL | Status: DC | PRN

## 2015-05-21 MED ORDER — ONDANSETRON HCL 4 MG/2ML IJ SOLN
4.0000 mg | Freq: Four times a day (QID) | INTRAMUSCULAR | Status: DC | PRN
  Filled 2015-05-21: qty 2

## 2015-05-21 MED ORDER — ACETAMINOPHEN 10 MG/ML IV SOLN
1000.0000 mg | Freq: Once | INTRAVENOUS | Status: AC
  Administered 2015-05-21: 1000 mg via INTRAVENOUS
  Filled 2015-05-21: qty 100

## 2015-05-21 MED ORDER — LIDOCAINE-EPINEPHRINE 1 %-1:100000 IJ SOLN
INTRAMUSCULAR | Status: AC
  Filled 2015-05-21: qty 1

## 2015-05-21 MED ORDER — GLYCOPYRROLATE 0.2 MG/ML IJ SOLN
INTRAMUSCULAR | Status: AC
  Filled 2015-05-21: qty 1

## 2015-05-21 MED ORDER — MIDAZOLAM HCL 2 MG/2ML IJ SOLN
INTRAMUSCULAR | Status: DC | PRN
  Administered 2015-05-21: 2 mg via INTRAVENOUS

## 2015-05-21 MED ORDER — KETOROLAC TROMETHAMINE 30 MG/ML IJ SOLN
INTRAMUSCULAR | Status: DC | PRN
  Administered 2015-05-21: 30 mg via INTRAVENOUS

## 2015-05-21 MED ORDER — PROPOFOL 10 MG/ML IV BOLUS
INTRAVENOUS | Status: DC | PRN
  Administered 2015-05-21: 180 mg via INTRAVENOUS
  Administered 2015-05-21: 20 mg via INTRAVENOUS

## 2015-05-21 MED ORDER — ROCURONIUM BROMIDE 100 MG/10ML IV SOLN
INTRAVENOUS | Status: AC
  Filled 2015-05-21: qty 1

## 2015-05-21 MED ORDER — ONDANSETRON HCL 4 MG PO TABS: 4.0000 mg | ORAL_TABLET | Freq: Four times a day (QID) | ORAL | Status: DC | PRN

## 2015-05-21 MED ORDER — HYDROMORPHONE 0.3 MG/ML IV SOLN
INTRAVENOUS | Status: DC
  Administered 2015-05-21: 0.2 mg via INTRAVENOUS
  Administered 2015-05-21: 0.799 mg via INTRAVENOUS
  Administered 2015-05-21: 0.4 mg via INTRAVENOUS
  Administered 2015-05-21: 12:00:00 via INTRAVENOUS
  Administered 2015-05-22: 0.6 mg via INTRAVENOUS
  Administered 2015-05-22: 0.199 mg via INTRAVENOUS
  Filled 2015-05-21: qty 25

## 2015-05-21 MED ORDER — IBUPROFEN 600 MG PO TABS
600.0000 mg | ORAL_TABLET | Freq: Four times a day (QID) | ORAL | Status: DC | PRN
  Administered 2015-05-22: 600 mg via ORAL
  Filled 2015-05-21: qty 1

## 2015-05-21 MED ORDER — FENTANYL CITRATE (PF) 100 MCG/2ML IJ SOLN
INTRAMUSCULAR | Status: DC | PRN
  Administered 2015-05-21: 100 ug via INTRAVENOUS
  Administered 2015-05-21: 50 ug via INTRAVENOUS
  Administered 2015-05-21: 100 ug via INTRAVENOUS
  Administered 2015-05-21: 50 ug via INTRAVENOUS
  Administered 2015-05-21 (×2): 100 ug via INTRAVENOUS

## 2015-05-21 MED ORDER — LACTATED RINGERS IV SOLN: INTRAVENOUS | Status: DC

## 2015-05-21 MED ORDER — PHENAZOPYRIDINE HCL 200 MG PO TABS
200.0000 mg | ORAL_TABLET | Freq: Once | ORAL | Status: AC
  Administered 2015-05-21: 200 mg via ORAL
  Filled 2015-05-21: qty 1

## 2015-05-21 MED ORDER — CEFAZOLIN SODIUM-DEXTROSE 2-3 GM-% IV SOLR: 2.0000 g | INTRAVENOUS | Status: DC

## 2015-05-21 MED ORDER — NEOSTIGMINE METHYLSULFATE 10 MG/10ML IV SOLN
INTRAVENOUS | Status: AC
  Filled 2015-05-21: qty 1

## 2015-05-21 MED ORDER — HYDROMORPHONE HCL 1 MG/ML IJ SOLN: 0.2000 mg | INTRAMUSCULAR | Status: DC | PRN

## 2015-05-21 MED ORDER — FENTANYL CITRATE (PF) 100 MCG/2ML IJ SOLN
INTRAMUSCULAR | Status: AC
  Filled 2015-05-21: qty 2

## 2015-05-21 MED ORDER — HYDROCODONE-ACETAMINOPHEN 5-325 MG PO TABS
1.0000 | ORAL_TABLET | ORAL | Status: DC | PRN
  Administered 2015-05-22: 1 via ORAL
  Filled 2015-05-21: qty 1

## 2015-05-21 MED ORDER — DEXAMETHASONE SODIUM PHOSPHATE 4 MG/ML IJ SOLN
INTRAMUSCULAR | Status: AC
  Filled 2015-05-21: qty 1

## 2015-05-21 MED ORDER — ROCURONIUM BROMIDE 100 MG/10ML IV SOLN
INTRAVENOUS | Status: DC | PRN
  Administered 2015-05-21: 40 mg via INTRAVENOUS
  Administered 2015-05-21: 10 mg via INTRAVENOUS

## 2015-05-21 MED ORDER — NEOSTIGMINE METHYLSULFATE 10 MG/10ML IV SOLN
INTRAVENOUS | Status: DC | PRN
  Administered 2015-05-21: 1 mg via INTRAVENOUS

## 2015-05-21 MED ORDER — SODIUM CHLORIDE 0.9 % IJ SOLN: 9.0000 mL | INTRAMUSCULAR | Status: DC | PRN

## 2015-05-21 MED ORDER — LACTATED RINGERS IV SOLN
INTRAVENOUS | Status: DC
  Administered 2015-05-21 (×2): via INTRAVENOUS

## 2015-05-21 MED ORDER — SCOPOLAMINE 1 MG/3DAYS TD PT72
MEDICATED_PATCH | TRANSDERMAL | Status: AC
  Administered 2015-05-21: 1.5 mg via TRANSDERMAL
  Filled 2015-05-21: qty 1

## 2015-05-21 MED ORDER — DIPHENHYDRAMINE HCL 12.5 MG/5ML PO ELIX: 12.5000 mg | ORAL_SOLUTION | Freq: Four times a day (QID) | ORAL | Status: DC | PRN

## 2015-05-21 MED ORDER — DEXAMETHASONE SODIUM PHOSPHATE 10 MG/ML IJ SOLN
INTRAMUSCULAR | Status: DC | PRN
  Administered 2015-05-21: 4 mg via INTRAVENOUS

## 2015-05-21 MED ORDER — PROMETHAZINE HCL 25 MG/ML IJ SOLN
6.2500 mg | INTRAMUSCULAR | Status: DC | PRN
  Administered 2015-05-21: 12.5 mg via INTRAVENOUS

## 2015-05-21 MED ORDER — PROMETHAZINE HCL 25 MG/ML IJ SOLN
INTRAMUSCULAR | Status: AC
  Filled 2015-05-21: qty 1

## 2015-05-21 MED ORDER — ONDANSETRON HCL 4 MG/2ML IJ SOLN
4.0000 mg | Freq: Four times a day (QID) | INTRAMUSCULAR | Status: DC | PRN
  Administered 2015-05-21: 4 mg via INTRAVENOUS

## 2015-05-21 MED ORDER — LIDOCAINE HCL (CARDIAC) 20 MG/ML IV SOLN
INTRAVENOUS | Status: AC
  Filled 2015-05-21: qty 5

## 2015-05-21 MED ORDER — NALOXONE HCL 0.4 MG/ML IJ SOLN: 0.4000 mg | INTRAMUSCULAR | Status: DC | PRN

## 2015-05-21 MED ORDER — FLUTICASONE PROPIONATE 50 MCG/ACT NA SUSP: 1.0000 | Freq: Two times a day (BID) | NASAL | Status: DC | PRN

## 2015-05-21 MED ORDER — BUPIVACAINE HCL (PF) 0.25 % IJ SOLN
INTRAMUSCULAR | Status: DC | PRN
  Administered 2015-05-21: 14 mL

## 2015-05-21 MED ORDER — LIDOCAINE-EPINEPHRINE 1 %-1:100000 IJ SOLN
INTRAMUSCULAR | Status: DC | PRN
  Administered 2015-05-21: 20 mL

## 2015-05-21 MED ORDER — SCOPOLAMINE 1 MG/3DAYS TD PT72
1.0000 | MEDICATED_PATCH | Freq: Once | TRANSDERMAL | Status: DC
  Administered 2015-05-21: 1.5 mg via TRANSDERMAL

## 2015-05-21 MED ORDER — BUPIVACAINE HCL (PF) 0.25 % IJ SOLN
INTRAMUSCULAR | Status: AC
  Filled 2015-05-21: qty 30

## 2015-05-21 MED ORDER — OXYCODONE-ACETAMINOPHEN 5-325 MG PO TABS: 1.0000 | ORAL_TABLET | ORAL | Status: DC | PRN

## 2015-05-21 MED ORDER — FENTANYL CITRATE (PF) 100 MCG/2ML IJ SOLN
25.0000 ug | INTRAMUSCULAR | Status: DC | PRN
  Administered 2015-05-21: 50 ug via INTRAVENOUS

## 2015-05-21 MED ORDER — PROPOFOL 10 MG/ML IV BOLUS
INTRAVENOUS | Status: AC
  Filled 2015-05-21: qty 20

## 2015-05-21 MED ORDER — GLYCOPYRROLATE 0.2 MG/ML IJ SOLN
INTRAMUSCULAR | Status: DC | PRN
  Administered 2015-05-21: 0.2 mg via INTRAVENOUS

## 2015-05-21 MED ORDER — AZELASTINE-FLUTICASONE 137-50 MCG/ACT NA SUSP: 1.0000 | Freq: Two times a day (BID) | NASAL | Status: DC | PRN

## 2015-05-21 MED ORDER — MIDAZOLAM HCL 2 MG/2ML IJ SOLN
INTRAMUSCULAR | Status: AC
  Filled 2015-05-21: qty 2

## 2015-05-21 MED ORDER — LIDOCAINE HCL (CARDIAC) 20 MG/ML IV SOLN
INTRAVENOUS | Status: DC | PRN
  Administered 2015-05-21: 100 mg via INTRAVENOUS

## 2015-05-21 MED ORDER — CEFAZOLIN SODIUM-DEXTROSE 2-3 GM-% IV SOLR
INTRAVENOUS | Status: AC
  Administered 2015-05-21: 2 g via INTRAVENOUS
  Filled 2015-05-21: qty 50

## 2015-05-21 SURGICAL SUPPLY — 43 items
CLOTH BEACON ORANGE TIMEOUT ST (SAFETY) ×3 IMPLANT
COVER BACK TABLE 60X90IN (DRAPES) ×3 IMPLANT
DECANTER SPIKE VIAL GLASS SM (MISCELLANEOUS) ×2 IMPLANT
DRESSING OPSITE X SMALL 2X3 (GAUZE/BANDAGES/DRESSINGS) ×1 IMPLANT
DRSG COVADERM PLUS 2X2 (GAUZE/BANDAGES/DRESSINGS) ×6 IMPLANT
DRSG OPSITE POSTOP 3X4 (GAUZE/BANDAGES/DRESSINGS) IMPLANT
DURAPREP 26ML APPLICATOR (WOUND CARE) ×3 IMPLANT
ELECT REM PT RETURN 9FT ADLT (ELECTROSURGICAL) ×3
ELECTRODE REM PT RTRN 9FT ADLT (ELECTROSURGICAL) ×2 IMPLANT
EVACUATOR SMOKE 8.L (FILTER) ×3 IMPLANT
GAUZE SPONGE 4X4 16PLY XRAY LF (GAUZE/BANDAGES/DRESSINGS) ×1 IMPLANT
GLOVE BIO SURGEON STRL SZ7 (GLOVE) ×6 IMPLANT
GLOVE BIOGEL PI IND STRL 6.5 (GLOVE) ×2 IMPLANT
GLOVE BIOGEL PI IND STRL 7.0 (GLOVE) ×4 IMPLANT
GLOVE BIOGEL PI INDICATOR 6.5 (GLOVE) ×1
GLOVE BIOGEL PI INDICATOR 7.0 (GLOVE) ×2
LIGASURE 5MM LAPAROSCOPIC (INSTRUMENTS) ×1 IMPLANT
LIGASURE IMPACT 36 18CM CVD LR (INSTRUMENTS) ×1 IMPLANT
LIGASURE LAP ATLAS 10MM 37CM (INSTRUMENTS) IMPLANT
LIQUID BAND (GAUZE/BANDAGES/DRESSINGS) ×1 IMPLANT
NS IRRIG 1000ML POUR BTL (IV SOLUTION) ×3 IMPLANT
PACK LAVH (CUSTOM PROCEDURE TRAY) ×3 IMPLANT
PACK ROBOTIC GOWN (GOWN DISPOSABLE) ×3 IMPLANT
PAD POSITIONER PINK NONSTERILE (MISCELLANEOUS) ×3 IMPLANT
SET CYSTO W/LG BORE CLAMP LF (SET/KITS/TRAYS/PACK) ×3 IMPLANT
SET IRRIG TUBING LAPAROSCOPIC (IRRIGATION / IRRIGATOR) ×1 IMPLANT
SLEEVE XCEL OPT CAN 5 100 (ENDOMECHANICALS) ×1 IMPLANT
SPONGE LAP 18X18 X RAY DECT (DISPOSABLE) ×1 IMPLANT
SUT VIC AB 0 CT1 18XCR BRD8 (SUTURE) ×6 IMPLANT
SUT VIC AB 0 CT1 8-18 (SUTURE) ×9
SUT VIC AB 2-0 CT1 (SUTURE) ×6 IMPLANT
SUT VIC AB 3-0 PS2 18 (SUTURE) ×3
SUT VIC AB 3-0 PS2 18XBRD (SUTURE) ×2 IMPLANT
SUT VICRYL 0 TIES 12 18 (SUTURE) ×3 IMPLANT
SUT VICRYL 0 UR6 27IN ABS (SUTURE) ×6 IMPLANT
SYR BULB IRRIGATION 50ML (SYRINGE) ×1 IMPLANT
TOWEL OR 17X24 6PK STRL BLUE (TOWEL DISPOSABLE) ×6 IMPLANT
TRAY FOLEY CATH SILVER 14FR (SET/KITS/TRAYS/PACK) ×3 IMPLANT
TROCAR BALLN 12MMX100 BLUNT (TROCAR) ×3 IMPLANT
TROCAR OPTI TIP 5M 100M (ENDOMECHANICALS) ×3 IMPLANT
TROCAR XCEL DIL TIP R 11M (ENDOMECHANICALS) ×6 IMPLANT
WARMER LAPAROSCOPE (MISCELLANEOUS) ×3 IMPLANT
WATER STERILE IRR 1000ML POUR (IV SOLUTION) ×3 IMPLANT

## 2015-05-21 NOTE — Addendum Note (Signed)
Addendum  created 05/21/15 1327 by Talbot Grumbling, CRNA   Modules edited: Notes Section   Notes Section:  File: [id number]

## 2015-05-21 NOTE — Anesthesia Postprocedure Evaluation (Signed)
  Anesthesia Post-op Note  Patient: Carla Massey  Procedure(s) Performed: Procedure(s): LAPAROSCOPIC ASSISTED VAGINAL HYSTERECTOMY (N/A) BILATERAL SALPINGECTOMY (Bilateral)  Patient Location: PACU  Anesthesia Type:General  Level of Consciousness: awake and alert   Airway and Oxygen Therapy: Patient Spontanous Breathing  Post-op Pain: mild  Post-op Assessment: Post-op Vital signs reviewed and Patient's Cardiovascular Status Stable  Post-op Vital Signs: Reviewed and stable  Last Vitals:  Filed Vitals:   05/21/15 1100  BP: 117/61  Pulse: 69  Temp:   Resp: 14    Complications: No apparent anesthesia complications

## 2015-05-21 NOTE — Anesthesia Postprocedure Evaluation (Signed)
Anesthesia Post Note  Patient: Carla Massey  Procedure(s) Performed: Procedure(s) (LRB): LAPAROSCOPIC ASSISTED VAGINAL HYSTERECTOMY (N/A) BILATERAL SALPINGECTOMY (Bilateral)  Anesthesia type: General  Patient location: Mother/Baby  Post pain: Pain level controlled  Post assessment: Post-op Vital signs reviewed  Last Vitals:  Filed Vitals:   05/21/15 1316  BP: 134/73  Pulse: 89  Temp: 36.7 C  Resp: 19    Post vital signs: Reviewed  Level of consciousness: awake and alert   Complications: No apparent anesthesia complications

## 2015-05-21 NOTE — Anesthesia Procedure Notes (Signed)
Procedure Name: Intubation Performed by: Casimer Lanius A Pre-anesthesia Checklist: Patient identified, Emergency Drugs available, Suction available and Patient being monitored Patient Re-evaluated:Patient Re-evaluated prior to inductionOxygen Delivery Method: Circle system utilized and Simple face mask Preoxygenation: Pre-oxygenation with 100% oxygen Intubation Type: IV induction and Inhalational induction Ventilation: Mask ventilation without difficulty Laryngoscope Size: Mac and 3 Grade View: Grade II Tube type: Oral Tube size: 7.0 mm Number of attempts: 1 Airway Equipment and Method: Stylet Placement Confirmation: ETT inserted through vocal cords under direct vision,  positive ETCO2 and breath sounds checked- equal and bilateral Secured at: 20 (right lip) cm Tube secured with: Tape Dental Injury: Teeth and Oropharynx as per pre-operative assessment

## 2015-05-21 NOTE — Progress Notes (Signed)
Patient ID: Carla Massey, female   DOB: 1961-11-09, 55 y.o.   MRN: [id number]  POD#0 S/P LAVH/BSO  S: Comfortable with PCA. Tolerating po well without nausea. No flatus yet. Pt anxious about nausea/vomiting with po pain medications. Has ambulated well x 1 since surgery O:  Filed Vitals:   05/21/15 1306 05/21/15 1316 05/21/15 1420 05/21/15 1546  BP:  134/73 136/79 123/90  Pulse:  89 94 87  Temp:  98.1 F (36.7 C) 98.3 F (36.8 C) 98.6 F (37 C)  TempSrc:  Oral Oral Oral  Resp: 16 19 20 20   Height:      Weight:      SpO2: 100% 100% 98% 98%    AOX3, NAD Abd soft, appropriately tender Inc dressing C/D/I SCDs in place  A/P 1) Routine PO care 2) Will trial po pain medications with hydrocodone first, start with 1. Will keep PCA running. If tolerates po pain meds then can d/c PCA. Consider toradol IV 30mg  Q 6 also 3) Keep foley until am

## 2015-05-21 NOTE — Transfer of Care (Signed)
Immediate Anesthesia Transfer of Care Note  Patient: Carla Massey  Procedure(s) Performed: Procedure(s): LAPAROSCOPIC ASSISTED VAGINAL HYSTERECTOMY (N/A) BILATERAL SALPINGECTOMY (Bilateral)  Patient Location: PACU  Anesthesia Type:General  Level of Consciousness: awake  Airway & Oxygen Therapy: Patient Spontanous Breathing  Post-op Assessment: Report given to PACU RN  Post vital signs: stable  Filed Vitals:   05/21/15 0550  BP: 134/82  Pulse: 76  Temp: 36.6 C  Resp: 18    Complications: No apparent anesthesia complications

## 2015-05-21 NOTE — H&P (Signed)
Carla Massey is an 54 y.o. female presents for LAVH  54 yo G2P2 female presents for surgical management of menorrhagia. Over the past year she has experience abnormal uterine bleeding that has not responded to medical management. Endometrial sampling was benign.Office ultrasound showed a 3-4 cm fundal fibroid. Sonohystogram demonstrated the fibroid to be submucosal. Management options were reviewed and the patient desires permanent surgical management. THerefore, the decision was made to proceed with hysterectomy. The decision was also made to remove the ovaries at the time of surgery therefore a laparoscopic assisted approach was chosen. R/B/A were reviewed and the patient wishes to proceed.    No LMP recorded.    Past Medical History  Diagnosis Date  . Hiatal hernia   . Esophageal reflux   . Motility disorder, esophageal   . Allergic rhinitis   . SVD (spontaneous vaginal delivery)     x 1  . Seasonal allergies   . Insomnia   . Depression     history  . Anemia     Past Surgical History  Procedure Laterality Date  . Wisdom tooth extraction    . Upper gi endoscopy    . Colonoscopy      Family History  Problem Relation Age of Onset  . Thyroid cancer Mother   . Hypertension Mother   . Colon cancer Neg Hx   . Stomach cancer Neg Hx   . Breast cancer Mother     Social History:  reports that she has never smoked. She has never used smokeless tobacco. She reports that she does not drink alcohol or use illicit drugs.  Allergies:  Allergies  Allergen Reactions  . Ketoconazole Hives    Nizoral for bacterial infection     Prescriptions prior to admission  Medication Sig Dispense Refill Last Dose  . Calcium Carbonate-Vitamin D (CALCIUM + D PO) Take 1 tablet by mouth 2 (two) times daily.    05/20/2015 at Unknown time  . desogestrel-ethinyl estradiol (AZURETTE) 0.15-0.02/0.01 MG (21/5) per tablet Take 1 tablet by mouth daily.     05/20/2015 at Unknown time  . DYMISTA 137-50 MCG/ACT  SUSP Place 1 spray into both nostrils 2 (two) times daily as needed (allergies).    05/20/2015 at Unknown time  . Ferrous Sulfate Dried 45 MG TBCR Take 1 tablet by mouth daily.   Past Week at Unknown time  . GuaiFENesin (MUCINEX PO) Take 1 tablet by mouth daily as needed (allergies and congestion).   Past Month at Unknown time  . ibuprofen (ADVIL,MOTRIN) 600 MG tablet Take 600 mg by mouth every 8 (eight) hours as needed for moderate pain.   Past Week at Unknown time  . montelukast (SINGULAIR) 10 MG tablet Take 10 mg by mouth at bedtime.   05/20/2015 at Unknown time  . omeprazole (PRILOSEC) 40 MG capsule TAKE ONE CAPSULE BY MOUTH EVERY DAY 30 capsule 0 05/21/2015 at Unknown time  . Probiotic Product (PROBIOTIC PO) Take 1 tablet by mouth daily.   05/20/2015 at Unknown time  . zolpidem (AMBIEN) 5 MG tablet Take 5 mg by mouth at bedtime as needed for sleep.   05/20/2015 at Unknown time  . omeprazole-sodium bicarbonate (ZEGERID) 40-1100 MG per capsule Take 1 capsule by mouth daily before breakfast. (Patient not taking: Reported on 05/06/2015) 90 capsule 3   . Omeprazole-Sodium Bicarbonate (ZEGERID) 40-1680 MG PACK Take 1 packet by mouth daily. (Patient not taking: Reported on 05/06/2015) 30 each 11     ROS: as above  Blood  pressure 134/82, pulse 76, temperature 97.9 F (36.6 C), temperature source Oral, resp. rate 18, SpO2 100 %. Physical Exam   AoX3, NAD Abd soft Uterus 9 week size, mobile non-tender  Results for orders placed or performed during the hospital encounter of 05/21/15 (from the past 72 hour(s))  Pregnancy, urine     Status: None   Collection Time: 05/21/15  6:00 AM  Result Value Ref Range   Preg Test, Ur NEGATIVE NEGATIVE    Comment:        THE SENSITIVITY OF THIS METHODOLOGY IS >20 mIU/mL.      Results for orders placed or performed during the hospital encounter of 05/21/15 (from the past 24 hour(s))  Pregnancy, urine     Status: None   Collection Time: 05/21/15  6:00 AM  Result  Value Ref Range   Preg Test, Ur NEGATIVE NEGATIVE    No results found.  Assessment/Plan: 1) Admit 2) Consent  3) Proceed with LAVH 4) SCDs to OR  Carla Massey H. 05/21/2015, 6:42 AM

## 2015-05-22 ENCOUNTER — Encounter (HOSPITAL_COMMUNITY): Payer: Self-pay | Admitting: Obstetrics and Gynecology

## 2015-05-22 DIAGNOSIS — N920 Excessive and frequent menstruation with regular cycle: Secondary | ICD-10-CM | POA: Diagnosis not present

## 2015-05-22 LAB — CBC
HCT: 27.6 % — ABNORMAL LOW (ref 36.0–46.0)
Hemoglobin: 8.8 g/dL — ABNORMAL LOW (ref 12.0–15.0)
MCH: 24.4 pg — AB (ref 26.0–34.0)
MCHC: 31.9 g/dL (ref 30.0–36.0)
MCV: 76.5 fL — ABNORMAL LOW (ref 78.0–100.0)
PLATELETS: 302 10*3/uL (ref 150–400)
RBC: 3.61 MIL/uL — AB (ref 3.87–5.11)
RDW: 17.6 % — ABNORMAL HIGH (ref 11.5–15.5)
WBC: 8.1 10*3/uL (ref 4.0–10.5)

## 2015-05-22 LAB — BASIC METABOLIC PANEL
Anion gap: 6 (ref 5–15)
BUN: 7 mg/dL (ref 6–20)
CALCIUM: 8 mg/dL — AB (ref 8.9–10.3)
CO2: 28 mmol/L (ref 22–32)
Chloride: 107 mmol/L (ref 101–111)
Creatinine, Ser: 0.6 mg/dL (ref 0.44–1.00)
GFR calc Af Amer: >60 mL/min (ref >60)
GFR calc non Af Amer: >60 mL/min (ref >60)
GLUCOSE: 112 mg/dL — AB (ref 65–99)
Potassium: 3.5 mmol/L (ref 3.5–5.1)
SODIUM: 141 mmol/L (ref 135–145)

## 2015-05-22 MED ORDER — IBUPROFEN 600 MG PO TABS: 600.0000 mg | ORAL_TABLET | Freq: Four times a day (QID) | ORAL | Status: DC | PRN

## 2015-05-22 MED ORDER — OXYCODONE-ACETAMINOPHEN 5-325 MG PO TABS: 1.0000 | ORAL_TABLET | ORAL | Status: DC | PRN

## 2015-05-22 MED ORDER — HYDROCODONE-ACETAMINOPHEN 5-325 MG PO TABS: 1.0000 | ORAL_TABLET | ORAL | Status: DC | PRN

## 2015-05-22 NOTE — Progress Notes (Signed)
1 Day Post-Op Procedure(s) (LRB): LAPAROSCOPIC ASSISTED VAGINAL HYSTERECTOMY (N/A) BILATERAL SALPINGECTOMY (Bilateral)  Subjective: Patient reports Adequate pain control, denies nausea or vomiting  Objective: Filed Vitals:   05/22/15 0115 05/22/15 0120 05/22/15 0541 05/22/15 0629  BP:  138/76  123/66  Pulse:  82  76  Temp:  98.9 F (37.2 C)  98.3 F (36.8 C)  TempSrc:  Oral  Oral  Resp: 19 16 18 20   Height:      Weight:      SpO2: 100% 98% 98% 98%     General: alert, cooperative and appears stated age GI: soft, non-tender; bowel sounds normal; no masses,  no organomegaly and incision: clean, dry and intact Extremities: extremities normal, atraumatic, no cyanosis or edema  Assessment: s/p Procedure(s): LAPAROSCOPIC ASSISTED VAGINAL HYSTERECTOMY (N/A) BILATERAL SALPINGECTOMY (Bilateral): progressing well and tolerating diet  Plan: Encourage ambulation Discontinue IV fluids Discharge home Will D/C foley and PCA. Once patient able to void ok for D/C home     Sharda Keddy H. 05/22/2015, 9:07 AM

## 2015-05-22 NOTE — Discharge Summary (Signed)
Physician Discharge Summary  Patient ID: KARISA NESSER MRN: [id number] DOB/AGE: 07/05/1961 54 y.o.  Admit date: 05/21/2015 Discharge date: 05/22/2015  Admission Diagnoses: 1) Menorrhagia 2) Anemia 3) Submucosal fibroid  Discharge Diagnoses: Same Active Problems:   Status post laparoscopic assisted vaginal hysterectomy (LAVH)   Discharged Condition: good  Hospital Course: Unremarkable  Consults: None  Significant Diagnostic Studies: labs:  Results for orders placed or performed during the hospital encounter of 05/21/15 (from the past 24 hour(s))  CBC     Status: Abnormal   Collection Time: 05/22/15  5:22 AM  Result Value Ref Range   WBC 8.1 4.0 - 10.5 K/uL   RBC 3.61 (L) 3.87 - 5.11 MIL/uL   Hemoglobin 8.8 (L) 12.0 - 15.0 g/dL   HCT 27.6 (L) 36.0 - 46.0 %   MCV 76.5 (L) 78.0 - 100.0 fL   MCH 24.4 (L) 26.0 - 34.0 pg   MCHC 31.9 30.0 - 36.0 g/dL   RDW 17.6 (H) 11.5 - 15.5 %   Platelets 302 150 - 400 K/uL  Basic metabolic panel     Status: Abnormal   Collection Time: 05/22/15  5:22 AM  Result Value Ref Range   Sodium 141 135 - 145 mmol/L   Potassium 3.5 3.5 - 5.1 mmol/L   Chloride 107 101 - 111 mmol/L   CO2 28 22 - 32 mmol/L   Glucose, Bld 112 (H) 65 - 99 mg/dL   BUN 7 6 - 20 mg/dL   Creatinine, Ser 0.60 0.44 - 1.00 mg/dL   Calcium 8.0 (L) 8.9 - 10.3 mg/dL   GFR calc non Af Amer >60 >60 mL/min   GFR calc Af Amer >60 >60 mL/min   Anion gap 6 5 - 15     Treatments: surgery: LAVH/BSO  Discharge Exam: Blood pressure 123/66, pulse 76, temperature 98.3 F (36.8 C), temperature source Oral, resp. rate 20, height 5\' 1"  (1.549 m), weight 65.772 kg (145 lb), SpO2 98 %. General appearance: alert, cooperative and appears stated age  Abd soft, Appropriately tender, ND Inc C/D/I  Disposition: Home  Discharge Instructions    Diet - low sodium heart healthy    Complete by:  As directed      Discharge wound care:    Complete by:  As directed   You may wash incision with soap  and water.  Do not soak or submerge the incision for 2 weeks. Keep incision dry. You may need to keep a sanitary pad or panty liner between the incision and your clothing for comfort and to keep the incision dry. If you note drainage, increased pain, or increased redness of the incision, then please notify your physician.     Increase activity slowly    Complete by:  As directed      Remove dressing in 24 hours    Complete by:  As directed             Medication List    STOP taking these medications        AZURETTE 0.15-0.02/0.01 MG (21/5) tablet  Generic drug:  desogestrel-ethinyl estradiol     Ferrous Sulfate Dried 45 MG Tbcr      TAKE these medications        CALCIUM + D PO  Take 1 tablet by mouth 2 (two) times daily.     DYMISTA 137-50 MCG/ACT Susp  Generic drug:  Azelastine-Fluticasone  Place 1 spray into both nostrils 2 (two) times daily as needed (allergies).  HYDROcodone-acetaminophen 5-325 MG per tablet  Commonly known as:  NORCO/VICODIN  Take 1-2 tablets by mouth every 4 (four) hours as needed for moderate pain.     ibuprofen 600 MG tablet  Commonly known as:  ADVIL,MOTRIN  Take 1 tablet (600 mg total) by mouth every 6 (six) hours as needed (mild pain).     montelukast 10 MG tablet  Commonly known as:  SINGULAIR  Take 10 mg by mouth at bedtime.     MUCINEX PO  Take 1 tablet by mouth daily as needed (allergies and congestion).     omeprazole 40 MG capsule  Commonly known as:  PRILOSEC  TAKE ONE CAPSULE BY MOUTH EVERY DAY     omeprazole-sodium bicarbonate 40-1100 MG per capsule  Commonly known as:  ZEGERID  Take 1 capsule by mouth daily before breakfast.     oxyCODONE-acetaminophen 5-325 MG per tablet  Commonly known as:  PERCOCET/ROXICET  Take 1-2 tablets by mouth every 4 (four) hours as needed (moderate to severe pain (when tolerating fluids)).     PROBIOTIC PO  Take 1 tablet by mouth daily.     zolpidem 5 MG tablet  Commonly known as:   AMBIEN  Take 5 mg by mouth at bedtime as needed for sleep.           Follow-up Information    Follow up with Marcial Pacas., MD In 4 weeks.   Specialty:  Obstetrics and Gynecology   Why:  For a post-op check   Contact information:   [place] [address] [phone]       Signed: Marcial Pacas 05/22/2015, 9:24 AM

## 2015-05-22 NOTE — Progress Notes (Signed)
Pt  Teaching complete  Daughter with pt   Called office for zofran  To be sent to drug store in [place]

## 2015-05-30 NOTE — Op Note (Signed)
Pre-Operative Diagnosis: 1) menorrhagia 2) anemia 3) uterine fibroid Postoperative diagnoses: same Procedure: laparoscopic assisted vaginal hysterectomy with bilateral salpingoophorectomy Surgeon: Dr. Vanessa Kick Asst.: Dr. Sanjuana Kava Operative findings: 9  Week size uterus. Normal appearing ovaries and tubes bilaterally Specimen:  Uterus, cervix, bilateral ovaries and tubes EBL: 200 cc  Procedure:  Carla Massey is a 54 -year-old G51 P2 female who presents for definitive surgical management for menorrhagia, anemia and a known submucosal uterine fibroids. Management options were discussed at length. Risks, benefits, alternatives of to definitive surgical management were reviewed and the patient wished to proceed. Following the appropriate informed consent the patient was taken to the operating room. General anesthesia was administered and she was placed in the dorsal body position. Prior to initiating the surgical procedure the patient was appropriately identified with a timeout procedure.   A Foley catheter was placed in the bladder in a sterile fashion. A speculum was placed in the vagina and a Hulka uterine manipulator was placed. The speculum was then removed.  Attention was now turned to the abdominal portion of the case.  The infra umbilical skin was infiltrated with quarter percent Marcaine. The skin was tented up with Alice clamps,  And a vertical in from the local skin incision was made. The underlying subcutaneous tissue was dissected bluntly. The fascia was grasped with  Coker clamps times two  And the fascia was incised with mayo scissors. Intra-abdominal entry was confirmed with direct visualization of abdominal contents. A Hassan port was placed and anchored to the fascia.  Abdominal cavity was insufflated and the camera was introduced. A right and left lower quadrant 5 mm ports were placed under direct visualization after the skin was infiltrated with quarter percent Marcaine and incised  with the scalpel.  At this time the patient was placed in Trendelenburg, a blunt probe was used to move the bowels into the upper abdominal cavity. The intra-abdominal contents were visualized and visually normal.  The ureters were identified bilaterally and noted to be well away from the operative field. The right fallopian tube was elevated with a atraumatic grasp or and a 5 mm Ligasure device was used to Clamp, cut and ligate the infundibulopelvic ligament.  Hemostasis was confirmed.  Successive bites down the misosalpinx along the fallopian tube were taken  And continued down the broad ligament to the level of the uterine arteries. The same procedure was repeated on the contralateral side. The bladder flap was created laparoscopically. At this time attention was turned to the vaginal portion of the case.   A weighted speculum was placed in the posterior vagina.  A Dever retractor was placed in the anterior vagina. A single toothed tenaculum was used to grasp the anterior lip of the cervix  And a paracervical block was administered using 1 to 100,000 lidocaine with epinephrine. Approximately 20 mL were administered. A scalpel was then used to make a circumferential incision in the vaginally mucosa over the cervix.  The vaginally mucosa was dissected off sharply anteriorly and posteriorly.  Posteriorly, the peritoneum was identified first, tented up, and entered sharply with Metzenbaum scissors. The short weighted speculum was removed and the long weighted speculum was placed.  A Ligasure device was used to clamp and cut the uterosacral ligaments bilaterally. These were suture ligated and tagged bilaterally.  Successive bites were taken with the ligature device bilaterally to the level of the uterine arteries. The urine arteries were clamped cut and suture ligated bilaterally.  At this point the anterior  vagina was dissected further off of the anterior cervix.  The anterior peritoneum was identified, tented up,  and entered sharply with Metzenbaum scissors. The dever retractor was then replaced into the abdominal cavity anteriorly.  Successive bites were taken bilaterally until the uterus was free and was removed.  At this point in McCall's  culdoplasty was performed.  The uterus uterosacral ligaments were plicated in the midline.  The vaginal cuff was then closed with O vicryl interrupted sutures.  And the uterosacral suspension sutures were tied down.  This completed the vaginal portion of the case.  Gowns and gloves were changed and we returned to the abdominal portion of the case.  The abdominal cavity was insuflated and the abdominal cavity was visualized and noted to be hemostatic.  The right and left lower quadrant ports were removed under direct visualization. The abdomen was desufflated and the hasson infraumbilical port was removed. The fascia was closed with 0 vicryl in a running fashion. The skin was closed with 3-0 vicryl in a subcuticular fashion and sealed with surgical skin glue. The skin of the right and left lower quadrant port sizes were closed with surgical skin glue.  This completed the procedure. All sponge, lap, and needle counts were correct. The patient was transferred to the post anesthesia care unit in good condition after extubation

## 2015-06-07 ENCOUNTER — Other Ambulatory Visit: Payer: Self-pay | Admitting: Internal Medicine

## 2015-08-30 ENCOUNTER — Other Ambulatory Visit: Payer: Self-pay

## 2015-08-30 DIAGNOSIS — Z1231 Encounter for screening mammogram for malignant neoplasm of breast: Secondary | ICD-10-CM

## 2015-09-25 ENCOUNTER — Ambulatory Visit: Payer: 59 | Admitting: Physician Assistant

## 2015-10-08 ENCOUNTER — Ambulatory Visit (INDEPENDENT_AMBULATORY_CARE_PROVIDER_SITE_OTHER): Payer: 59 | Admitting: Neurology

## 2015-10-08 ENCOUNTER — Encounter: Payer: Self-pay | Admitting: Neurology

## 2015-10-08 VITALS — BP 142/87 | HR 70 | Ht 61.0 in | Wt 140.0 lb

## 2015-10-08 DIAGNOSIS — R202 Paresthesia of skin: Secondary | ICD-10-CM | POA: Diagnosis not present

## 2015-10-08 DIAGNOSIS — M791 Myalgia, unspecified site: Secondary | ICD-10-CM

## 2015-10-08 DIAGNOSIS — R5382 Chronic fatigue, unspecified: Secondary | ICD-10-CM

## 2015-10-08 DIAGNOSIS — D649 Anemia, unspecified: Secondary | ICD-10-CM

## 2015-10-08 NOTE — Progress Notes (Signed)
PATIENT: Carla Massey DOB: Oct 22, 1961  Chief Complaint  Patient presents with  . Muscle Weakness    Reports feeling normal every morning but by the end of the day, she has been experiencing full body weakness and stiffness.  She has intermittent tingling in her bilateral lower extremities.  On occasion, she also has achy, pain in her arms that wakes her up out of sleep.  She is frequently nauseated. Lastly, she had one occurrence of seeing flashing lights.  She went to her eye doctor and was told it may have been an occular migraine. Says all these symptoms started six weeks ago.     HISTORICAL  Carla Massey is a 54 years old right-handed female, seen in refer by her primary care physician Dr. Joeseph Amor October 08 2015  She had a history of hysterectomy in June 2016 due to have menstrual bleeding, fibroid, was anemic prior to the surgery, hemoglobin was 8.8  Ever since then, she noticed gradual onset generalized fatigue, the best time is in the early morning, as day progressed, she noticed more generalized muscle fatigue, achiness, needs to push up to get up from seated position, also noticed bilateral lower extremity stiffness, intermittent numbness, sometimes woke up with bilateral upper extremity achiness, numbness.   She was seen by her primary care in September 2016 laboratory showed normal CMP, CBC, ESR, Lyme titer,  with exception of continued evidence of mild anemia per  She denies low back pain, denies neck pain, no persistent sensory loss, in addition, today's examination showed small oropharyngeal, she complains of insomnia, frequent awakening, snoring, daytime fatigue and sleepiness.   REVIEW OF SYSTEMS: Full 14 system review of systems performed and notable only for fatigue, anemia, joint pain, achy muscles weakness, insomnia   ALLERGIES: Allergies  Allergen Reactions  . Ketoconazole Hives    Nizoral for bacterial infection     HOME MEDICATIONS: Current Outpatient  Prescriptions  Medication Sig Dispense Refill  . Calcium Carbonate-Vitamin D (CALCIUM + D PO) Take 1 tablet by mouth 2 (two) times daily.     Marland Kitchen DYMISTA 137-50 MCG/ACT SUSP Place 1 spray into both nostrils 2 (two) times daily as needed (allergies).     . eszopiclone (LUNESTA) 2 MG TABS tablet as needed.  1  . FLUOCINOLONE ACETONIDE SCALP 0.01 % OIL as needed.  5  . GuaiFENesin (MUCINEX PO) Take 1 tablet by mouth daily as needed (allergies and congestion).    Marland Kitchen ibuprofen (ADVIL,MOTRIN) 600 MG tablet Take 1 tablet (600 mg total) by mouth every 6 (six) hours as needed (mild pain). 30 tablet 0  . montelukast (SINGULAIR) 10 MG tablet Take 10 mg by mouth at bedtime.    Marland Kitchen omeprazole (PRILOSEC) 40 MG capsule TAKE 1 CAPSULE BY MOUTH EVERY DAY 30 capsule 0  . Probiotic Product (PROBIOTIC PO) Take 1 tablet by mouth daily.    Marland Kitchen zolpidem (AMBIEN) 5 MG tablet Take 5 mg by mouth at bedtime as needed for sleep.     No current facility-administered medications for this visit.    PAST MEDICAL HISTORY: Past Medical History  Diagnosis Date  . Hiatal hernia   . Esophageal reflux   . Motility disorder, esophageal   . Allergic rhinitis   . SVD (spontaneous vaginal delivery)     x 1  . Seasonal allergies   . Insomnia   . Depression     history  . Anemia   . Muscle weakness     PAST  SURGICAL HISTORY: Past Surgical History  Procedure Laterality Date  . Wisdom tooth extraction    . Upper gi endoscopy    . Colonoscopy    . Laparoscopic assisted vaginal hysterectomy N/A 05/21/2015    Procedure: LAPAROSCOPIC ASSISTED VAGINAL HYSTERECTOMY;  Surgeon: Vanessa Kick, MD;  Location: [facility];  Service: Gynecology;  Laterality: N/A;  . Bilateral salpingectomy Bilateral 05/21/2015    Procedure: BILATERAL SALPINGECTOMY;  Surgeon: Vanessa Kick, MD;  Location: [facility];  Service: Gynecology;  Laterality: Bilateral;    FAMILY HISTORY: Family History  Problem Relation Age of Onset  . Thyroid cancer Mother   .  Hypertension Mother   . Breast cancer Mother   . Hypertension Father     SOCIAL HISTORY:  Social History   Social History  . Marital Status: Married    Spouse Name: N/A  . Number of Children: 1  . Years of Education: HS   Occupational History  . Office IT trainer   Social History Main Topics  . Smoking status: Never Smoker   . Smokeless tobacco: Never Used  . Alcohol Use: No  . Drug Use: No  . Sexual Activity: Yes    Birth Control/ Protection: Pill   Other Topics Concern  . Not on file   Social History Narrative   Lives at home with husband.   Right-handed.   16oz caffeine daily.     PHYSICAL EXAM   Filed Vitals:   10/08/15 0732  BP: 142/87  Pulse: 70  Height: _0  (1.549 m)  Weight: 140 lb (63.504 kg)    Not recorded      Body mass index is 26.47 kg/(m^2).  PHYSICAL EXAMNIATION:  Gen: NAD, conversant, well nourised, obese, well groomed                     Cardiovascular: Regular rate rhythm, no peripheral edema, warm, nontender. Eyes: Conjunctivae clear without exudates or hemorrhage Neck: Supple, no carotid bruise. Pulmonary: Clear to auscultation bilaterally   NEUROLOGICAL EXAM:  MENTAL STATUS: Speech:    Speech is normal; fluent and spontaneous with normal comprehension.  Cognition:     Orientation to time, place and person     Normal recent and remote memory     Normal Attention span and concentration     Normal Language, naming, repeating,spontaneous speech     Fund of knowledge   CRANIAL NERVES: CN II: Visual fields are full to confrontation. Fundoscopic exam is normal with sharp discs and no vascular changes. Pupils are round equal and briskly reactive to light. CN III, IV, VI: extraocular movement are normal. No ptosis. CN V: Facial sensation is intact to pinprick in all 3 divisions bilaterally. Corneal responses are intact.  CN VII: Face is symmetric with normal eye closure and smile. CN VIII: Hearing is normal  to rubbing fingers CN IX, X: Palate elevates symmetrically. Phonation is normal. CN XI: Head turning and shoulder shrug are intact CN XII: Tongue is midline with normal movements and no atrophy.  MOTOR: There is no pronator drift of out-stretched arms. Muscle bulk and tone are normal. Muscle strength is normal.  REFLEXES: Reflexes are 3 and symmetric at the biceps, triceps, knees, and ankles. Plantar responses are flexor.  SENSORY: Intact to light touch, pinprick, position sense, and vibration sense are intact in fingers and toes.  COORDINATION: Rapid alternating movements and fine finger movements are intact. There is no dysmetria on finger-to-nose and heel-knee-shin.  GAIT/STANCE: Posture is normal. Gait is steady with normal steps, base, arm swing, and turning. Heel and toe walking are normal. Tandem gait is normal.  Romberg is absent.   DIAGNOSTIC DATA (LABS, IMAGING, TESTING) - I reviewed patient records, labs, notes, testing and imaging myself where available.   ASSESSMENT AND PLAN  Carla Massey is a 54 y.o. female   Bilateral lower extremity intermittent paresthesia, stiffness, achy pain  Hyperreflexia on examination, potentially localized to cervical cord  MRI of cervical  Laboratory evaluations, including TSH, vitamin D level B12 Anemia  Iron deficiency anemia due to previous heavy menstrual bleeding Small jaw, snoring, daytime fatigue, sleepiness   ESS is 5, FSS is 20   Potential obstructive sleep apnea, she has narrow oropharyngeal space,  Marcial Pacas, M.D. Ph.D.  [facility] [address], [place], [place] [address] Ph: [phone] Fax: [phone]  CC: Sharilyn Sites, MD

## 2015-10-09 ENCOUNTER — Ambulatory Visit
Admission: RE | Admit: 2015-10-09 | Discharge: 2015-10-09 | Disposition: A | Discharge status: Home / Self Care | Payer: 59 | Source: Ambulatory Visit

## 2015-10-09 ENCOUNTER — Ambulatory Visit (INDEPENDENT_AMBULATORY_CARE_PROVIDER_SITE_OTHER): Payer: 59

## 2015-10-09 ENCOUNTER — Telehealth: Payer: Self-pay | Admitting: Neurology

## 2015-10-09 DIAGNOSIS — Z1231 Encounter for screening mammogram for malignant neoplasm of breast: Secondary | ICD-10-CM

## 2015-10-09 DIAGNOSIS — M791 Myalgia, unspecified site: Secondary | ICD-10-CM

## 2015-10-09 DIAGNOSIS — R202 Paresthesia of skin: Secondary | ICD-10-CM | POA: Diagnosis not present

## 2015-10-09 DIAGNOSIS — R5382 Chronic fatigue, unspecified: Secondary | ICD-10-CM | POA: Diagnosis not present

## 2015-10-09 LAB — VITAMIN D 25 HYDROXY (VIT D DEFICIENCY, FRACTURES): Vit D, 25-Hydroxy: 33.7 ng/mL (ref 30.0–100.0)

## 2015-10-09 LAB — VITAMIN B12: VITAMIN B 12: 1051 pg/mL — AB (ref 211–946)

## 2015-10-09 LAB — IRON AND TIBC
Iron Saturation: 8 % — CL (ref 15–55)
Iron: 39 ug/dL (ref 27–159)
TIBC: 471 ug/dL — AB (ref 250–450)
UIBC: 432 ug/dL — ABNORMAL HIGH (ref 131–425)

## 2015-10-09 LAB — FERRITIN: FERRITIN: 8 ng/mL — AB (ref 15–150)

## 2015-10-09 LAB — CK: CK TOTAL: 81 U/L (ref 24–173)

## 2015-10-09 MED ORDER — FERROUS FUM-IRON POLYSACCH 162-115.2 MG PO CAPS: 1.0000 | ORAL_CAPSULE | Freq: Every day | ORAL | Status: DC

## 2015-10-09 NOTE — Telephone Encounter (Signed)
Please call patient, MRI of the cervical spine showed multilevel degenerative disc disease, but no evidence of cervical cord compression I reviewed the detail at her next follow-up visit in October 30 2015  IMPRESSION: Abnormal MRI scan of cervical spine showing prominent spondylitic changes from C4-C6 most severe at C5-6 where there is central and left lateral disc osteophyte protrusion resulting in moderate canal and left-sided foraminal narrowing.,

## 2015-10-09 NOTE — Telephone Encounter (Signed)
Please call patient, laboratory showed low ferritin, iron saturation level, suggestive of low iron store  I have started iron rich vitamin supplement tandem one tab po qday

## 2015-10-09 NOTE — Telephone Encounter (Signed)
Spoke to [facility] - aware of results and will start the iron supplement.

## 2015-10-10 NOTE — Telephone Encounter (Signed)
She is aware of her results and will keep her follow up appt for further discussion.

## 2015-10-11 ENCOUNTER — Telehealth: Payer: Self-pay | Admitting: Internal Medicine

## 2015-10-11 NOTE — Telephone Encounter (Signed)
I scheduled an appt with her for Nicoletta Ba PA on 10/21/15 2:00

## 2015-10-21 ENCOUNTER — Other Ambulatory Visit (INDEPENDENT_AMBULATORY_CARE_PROVIDER_SITE_OTHER): Payer: 59

## 2015-10-21 ENCOUNTER — Ambulatory Visit (INDEPENDENT_AMBULATORY_CARE_PROVIDER_SITE_OTHER): Payer: 59 | Admitting: Physician Assistant

## 2015-10-21 ENCOUNTER — Encounter: Payer: Self-pay | Admitting: Physician Assistant

## 2015-10-21 VITALS — BP 124/68 | HR 84 | Ht 61.5 in | Wt 137.5 lb

## 2015-10-21 DIAGNOSIS — R110 Nausea: Secondary | ICD-10-CM

## 2015-10-21 LAB — COMPREHENSIVE METABOLIC PANEL
ALK PHOS: 83 U/L (ref 39–117)
ALT: 13 U/L (ref 0–35)
AST: 16 U/L (ref 0–37)
Albumin: 4.1 g/dL (ref 3.5–5.2)
BUN: 9 mg/dL (ref 6–23)
CO2: 32 meq/L (ref 19–32)
Calcium: 9.9 mg/dL (ref 8.4–10.5)
Chloride: 101 mEq/L (ref 96–112)
Creatinine, Ser: 0.66 mg/dL (ref 0.40–1.20)
GFR: 99.64 mL/min (ref >60.00)
GLUCOSE: 100 mg/dL — AB (ref 70–99)
POTASSIUM: 3.3 meq/L — AB (ref 3.5–5.1)
SODIUM: 142 meq/L (ref 135–145)
Total Bilirubin: 0.3 mg/dL (ref 0.2–1.2)
Total Protein: 7 g/dL (ref 6.0–8.3)

## 2015-10-21 LAB — H. PYLORI ANTIBODY, IGG: H Pylori IgG: NEGATIVE

## 2015-10-21 MED ORDER — RANITIDINE HCL 150 MG PO TABS: 150.0000 mg | ORAL_TABLET | Freq: Two times a day (BID) | ORAL | Status: DC

## 2015-10-21 NOTE — Patient Instructions (Addendum)
Please go to the basement level to have your labs drawn.  We sent a prescription to [facility], [address]/[place] for Zantac 150 mg. Take no NSAIDS at this time. Aleve, Mortin, Advil, Ibuprofen.  Increase oral iron to twice daily.  You have been scheduled for an endoscopy. Please follow written instructions given to you at your visit today. If you use inhalers (even only as needed), please bring them with you on the day of your procedure. Your physician has requested that you go to www.startemmi.com and enter the access code given to you at your visit today. This web site gives a general overview about your procedure. However, you should still follow specific instructions given to you by our office regarding your preparation for the procedure.

## 2015-10-21 NOTE — Progress Notes (Signed)
Patient ID: Carla Massey, female   DOB: [birth year], 54 y.o.   MRN: [id number]   Subjective:    Patient ID: Carla Massey, female    DOB: Feb 24, 1961, 54 y.o.   MRN: [id number]  HPI Carla Massey is a pleasant 54 year old white female known to Dr. Carlean Purl. She comes in today for evaluation of nausea she had undergone an EGD in 2012 per Dr. Sharlett Iles which showed a small hiatal hernia and otherwise negative exam. Colonoscopy was done in February 2014 and was a normal exam. Patient underwent hysterectomy and BSO in June 2016 for menorrhagia with iron deficiency anemia. Her hemoglobin at that time was 8.8 hematocrit of 27 and MCV of 76. Iron studies done in June 2016 showed iron of 39 TIBC of 471 and ferritin of 8. She has just recently started on iron supplementation in the form of Tandem once daily.. Patient says hemoglobin done last month was up to 11 at her primary care physicians. She has been feeling poorly over the past couple of months with an achy type feeling all over associated fatigue and some paresthesias. It actually undergone a thorough evaluation and had an MRI and is now being referred to believe to a neurosurgeon. She had been on Prilosec long-term for GERD and says she stopped that a few weeks ago and actually her achiness has improved. Says she has been nauseated for the past 2-3 months fairly constantly she feels best early in the morning but often feels nauseated before she even eats breakfast. She has not been on anything for acid reflux and stopping the Prilosec. She says she generally gets hungry but then after eating generally feels worse. She has not been vomiting, weight is down about 3 pounds over the past 3 weeks no fever or chills no diarrhea. No regular NSAIDs no complaints of abdominal pain. She does mention occasional choking sensation even with swallowing saliva.  Review of Systems Pertinent positive and negative review of systems were noted in the above HPI section.  All other review of  systems was otherwise negative.  Outpatient Encounter Prescriptions as of 10/21/2015  Medication Sig  . Calcium Carbonate-Vitamin D (CALCIUM + D PO) Take 1 tablet by mouth 2 (two) times daily.   Marland Kitchen DYMISTA 137-50 MCG/ACT SUSP Place 1 spray into both nostrils 2 (two) times daily as needed (allergies).   . eszopiclone (LUNESTA) 2 MG TABS tablet as needed.  . ferrous fumarate-iron polysaccharide complex (TANDEM) 162-115.2 MG CAPS capsule Take 1 capsule by mouth daily with breakfast.  . FLUOCINOLONE ACETONIDE SCALP 0.01 % OIL as needed.  . GuaiFENesin (MUCINEX PO) Take 1 tablet by mouth daily as needed (allergies and congestion).  Marland Kitchen ibuprofen (ADVIL,MOTRIN) 600 MG tablet Take 1 tablet (600 mg total) by mouth every 6 (six) hours as needed (mild pain).  . montelukast (SINGULAIR) 10 MG tablet Take 10 mg by mouth at bedtime.  . Probiotic Product (PROBIOTIC PO) Take 1 tablet by mouth daily.  . ranitidine (ZANTAC) 150 MG tablet Take 1 tablet (150 mg total) by mouth 2 (two) times daily.  . [DISCONTINUED] omeprazole (PRILOSEC) 40 MG capsule TAKE 1 CAPSULE BY MOUTH EVERY DAY  . [DISCONTINUED] zolpidem (AMBIEN) 5 MG tablet Take 5 mg by mouth at bedtime as needed for sleep.   No facility-administered encounter medications on file as of 10/21/2015.   Allergies  Allergen Reactions  . Ketoconazole Hives    Nizoral for bacterial infection    Patient Active Problem List   Diagnosis Date Noted  .  Status post laparoscopic assisted vaginal hysterectomy (LAVH) 05/21/2015  . Esophageal reflux 05/15/2011  . Raynauds phenomenon 05/15/2011  . Chest pain, mid sternal 05/15/2011   Social History   Social History  . Marital Status: Married    Spouse Name: N/A  . Number of Children: 1  . Years of Education: HS   Occupational History  . Office IT trainer   Social History Main Topics  . Smoking status: Never Smoker   . Smokeless tobacco: Never Used  . Alcohol Use: No  . Drug Use: No  .  Sexual Activity: Yes   Other Topics Concern  . Not on file   Social History Narrative   Lives at home with husband.   Right-handed.   16oz caffeine daily.    Carla Massey family history includes Breast cancer in her mother; Hypertension in her father and mother; Thyroid cancer in her mother.      Objective:    Filed Vitals:   10/21/15 1353  BP: 124/68  Pulse: 84    Physical Exam  well-developed white female in no acute distress, pleasant blood pressure 124/68 pulse 84 height 5 foot 1 weight 137. HEENT; nontraumatic normocephalic EOMI PERRLA sclera anicteric, Cardiovascular; regular rate and rhythm with S1-S2 no murmur rub or gallop, Pulmonary; clear bilaterally, Abdomen ;soft basically nontender nondistended bowel sounds are active there is no palpable mass or hepatosplenomegaly, Rectal ;exam not done, Ext; no clubbing cyanosis or edema skin warm and dry, Neuropsych ;mood and affect appropriate       Assessment & Plan:   #1  54 yo female with persistent nausea x 2-3 months-etiology not clear- R/O GB disease, R/O gastritis /PUD #2 Fe deficiency anemia- secondary to menorrhagia- s/p hysterectomy 05/2015- hgb improving , need Iron stores replaced #3 chronic GERD- ? Myalgias with PPI #4 up to date with colon screening- normal colon 2014  Plan; Zantac 150 mg po BID CMET,, Hpylori AB  increase oral  iron to BID - will need iron studies repeated in 3-4 months  Schedule for  Upper abdominal US  Schedule for EGD with Dr Carlean Purl- procedure discussed in detail with pt and she is agreeable to proceed.    Shykeria Sakamoto Genia Harold PA-C 10/21/2015   Cc: Sharilyn Sites, MD

## 2015-10-23 NOTE — Progress Notes (Signed)
Agree with Ms. Esterwood's assessment and plan. Carl E. Gessner, MD, FACG   

## 2015-10-24 ENCOUNTER — Other Ambulatory Visit: Payer: Self-pay

## 2015-10-24 MED ORDER — POTASSIUM CHLORIDE ER 10 MEQ PO TBCR: 10.0000 meq | EXTENDED_RELEASE_TABLET | Freq: Two times a day (BID) | ORAL | Status: DC

## 2015-10-25 ENCOUNTER — Other Ambulatory Visit: Payer: Self-pay | Admitting: Physician Assistant

## 2015-10-28 ENCOUNTER — Other Ambulatory Visit: Payer: Self-pay

## 2015-10-28 ENCOUNTER — Telehealth: Payer: Self-pay | Admitting: Physician Assistant

## 2015-10-28 DIAGNOSIS — R112 Nausea with vomiting, unspecified: Secondary | ICD-10-CM

## 2015-10-28 MED ORDER — ONDANSETRON HCL 4 MG PO TABS: 4.0000 mg | ORAL_TABLET | Freq: Four times a day (QID) | ORAL | Status: DC | PRN

## 2015-10-28 NOTE — Telephone Encounter (Signed)
Zofran 4 mg po q 6 hours prn  #50/0 and schedule upper abdominal US

## 2015-10-28 NOTE — Telephone Encounter (Signed)
Patient is advised. She will go to the [facility] on 11/04/15 at 8:45 am. NPO after midnight. She has the phone number for scheduling. Medication e-scribed to [facility].

## 2015-10-28 NOTE — Telephone Encounter (Signed)
She is on Zantac BID as ordered. The H-Pylori was negative. She did not get scheduled for an u/s of the abdomen. Can she have something for nausea?

## 2015-10-30 ENCOUNTER — Encounter: Payer: Self-pay | Admitting: Neurology

## 2015-10-30 ENCOUNTER — Ambulatory Visit (INDEPENDENT_AMBULATORY_CARE_PROVIDER_SITE_OTHER): Payer: 59 | Admitting: Neurology

## 2015-10-30 VITALS — BP 141/81 | HR 69 | Ht 61.5 in | Wt 136.0 lb

## 2015-10-30 DIAGNOSIS — D509 Iron deficiency anemia, unspecified: Secondary | ICD-10-CM

## 2015-10-30 DIAGNOSIS — M47812 Spondylosis without myelopathy or radiculopathy, cervical region: Secondary | ICD-10-CM

## 2015-10-30 NOTE — Progress Notes (Signed)
PATIENT: Carla Massey DOB: 1961-10-03  Chief Complaint  Patient presents with  . Muscle Weakness    She is here to discuss her lab and MRI results.     HISTORICAL  Carla Massey is a 54 years old right-handed female, seen in refer by her primary care physician Dr. Joeseph Amor in October 08 2015  She had a history of hysterectomy in June 2016 due to have menstrual bleeding, fibroid, was anemic prior to the surgery, hemoglobin was 8.8  Ever since then, she noticed gradual onset generalized fatigue, the best time is in the early morning, as day progressed, she noticed more generalized muscle fatigue, achiness, needs to push up to get up from seated position, also noticed bilateral lower extremity stiffness, intermittent numbness, sometimes woke up with bilateral upper extremity achiness, numbness.   She was seen by her primary care in September 2016 laboratory showed normal CMP, CBC, ESR, Lyme titer,  with exception of continued evidence of mild anemia   She denies low back pain, denies neck pain, no persistent sensory loss, in addition, today's examination showed small oropharyngeal, she complains of insomnia, frequent awakening, snoring, daytime fatigue and sleepiness.   UPDATE Oct 30 2015: We have reviewed laboratory evaluation, ferritin was low 8, low iron saturation rate of 8, normal B12,  We have reviewed MRI of the cervical spine, multilevel cervical degenerative disc disease most severe at C5-6, with evidence of mild to moderate canal stenosis, foraminal stenosis.   REVIEW OF SYSTEMS: Full 14 system review of systems performed and notable only for as above  ALLERGIES: Allergies  Allergen Reactions  . Ketoconazole Hives    Nizoral for bacterial infection     HOME MEDICATIONS: Current Outpatient Prescriptions  Medication Sig Dispense Refill  . Calcium Carbonate-Vitamin D (CALCIUM + D PO) Take 1 tablet by mouth 2 (two) times daily.     Marland Kitchen DYMISTA 137-50 MCG/ACT SUSP Place  1 spray into both nostrils 2 (two) times daily as needed (allergies).     . eszopiclone (LUNESTA) 2 MG TABS tablet as needed.  1  . ferrous fumarate-iron polysaccharide complex (TANDEM) 162-115.2 MG CAPS capsule Take 1 capsule by mouth daily with breakfast. 30 capsule 11  . FLUOCINOLONE ACETONIDE SCALP 0.01 % OIL as needed.  5  . GuaiFENesin (MUCINEX PO) Take 1 tablet by mouth daily as needed (allergies and congestion).    Marland Kitchen ibuprofen (ADVIL,MOTRIN) 600 MG tablet Take 1 tablet (600 mg total) by mouth every 6 (six) hours as needed (mild pain). 30 tablet 0  . montelukast (SINGULAIR) 10 MG tablet Take 10 mg by mouth at bedtime.    . ondansetron (ZOFRAN) 4 MG tablet Take 1 tablet (4 mg total) by mouth every 6 (six) hours as needed for nausea or vomiting. 50 tablet 0  . potassium chloride (K-DUR) 10 MEQ tablet TAKE 1 TABLET(10 MEQ) BY MOUTH TWICE DAILY 6 tablet 0  . Probiotic Product (PROBIOTIC PO) Take 1 tablet by mouth daily.    . ranitidine (ZANTAC) 150 MG tablet Take 1 tablet (150 mg total) by mouth 2 (two) times daily. 60 tablet 6   No current facility-administered medications for this visit.    PAST MEDICAL HISTORY: Past Medical History  Diagnosis Date  . Hiatal hernia   . Esophageal reflux   . Motility disorder, esophageal   . Allergic rhinitis   . SVD (spontaneous vaginal delivery)     x 1  . Seasonal allergies   . Insomnia   .  Depression     history  . Anemia   . Muscle weakness     PAST SURGICAL HISTORY: Past Surgical History  Procedure Laterality Date  . Wisdom tooth extraction    . Upper gi endoscopy    . Colonoscopy    . Laparoscopic assisted vaginal hysterectomy N/A 05/21/2015    Procedure: LAPAROSCOPIC ASSISTED VAGINAL HYSTERECTOMY;  Surgeon: Vanessa Kick, MD;  Location: [facility];  Service: Gynecology;  Laterality: N/A;  . Bilateral salpingectomy Bilateral 05/21/2015    Procedure: BILATERAL SALPINGECTOMY;  Surgeon: Vanessa Kick, MD;  Location: [facility];  Service: Gynecology;   Laterality: Bilateral;    FAMILY HISTORY: Family History  Problem Relation Age of Onset  . Thyroid cancer Mother   . Hypertension Mother   . Breast cancer Mother   . Hypertension Father     SOCIAL HISTORY:  Social History   Social History  . Marital Status: Married    Spouse Name: N/A  . Number of Children: 1  . Years of Education: HS   Occupational History  . Office IT trainer   Social History Main Topics  . Smoking status: Never Smoker   . Smokeless tobacco: Never Used  . Alcohol Use: No  . Drug Use: No  . Sexual Activity: Yes   Other Topics Concern  . Not on file   Social History Narrative   Lives at home with husband.   Right-handed.   16oz caffeine daily.     PHYSICAL EXAM   Filed Vitals:   10/30/15 1007  BP: 141/81  Pulse: 69  Height: 5' 1.5" (1.562 m)  Weight: 136 lb (61.689 kg)    Not recorded      Body mass index is 25.28 kg/(m^2).  PHYSICAL EXAMNIATION:  Gen: NAD, conversant, well nourised, obese, well groomed                     Cardiovascular: Regular rate rhythm, no peripheral edema, warm, nontender. Eyes: Conjunctivae clear without exudates or hemorrhage Neck: Supple, no carotid bruise. Pulmonary: Clear to auscultation bilaterally   NEUROLOGICAL EXAM:  MENTAL STATUS: Speech:    Speech is normal; fluent and spontaneous with normal comprehension.  Cognition:     Orientation to time, place and person     Normal recent and remote memory     Normal Attention span and concentration     Normal Language, naming, repeating,spontaneous speech     Fund of knowledge   CRANIAL NERVES: CN II: Visual fields are full to confrontation. Fundoscopic exam is normal with sharp discs and no vascular changes. Pupils are round equal and briskly reactive to light. CN III, IV, VI: extraocular movement are normal. No ptosis. CN V: Facial sensation is intact to pinprick in all 3 divisions bilaterally. Corneal responses are intact.    CN VII: Face is symmetric with normal eye closure and smile. CN VIII: Hearing is normal to rubbing fingers CN IX, X: Palate elevates symmetrically. Phonation is normal. CN XI: Head turning and shoulder shrug are intact CN XII: Tongue is midline with normal movements and no atrophy.  MOTOR: There is no pronator drift of out-stretched arms. Muscle bulk and tone are normal. Muscle strength is normal.  REFLEXES: Reflexes are 3 and symmetric at the biceps, triceps, knees, and ankles. Plantar responses are flexor.  SENSORY: Intact to light touch, pinprick, position sense, and vibration sense are intact in fingers and toes.  COORDINATION: Rapid alternating movements and fine finger  movements are intact. There is no dysmetria on finger-to-nose and heel-knee-shin.    GAIT/STANCE: Posture is normal. Gait is steady with normal steps, base, arm swing, and turning. Heel and toe walking are normal. Tandem gait is normal.  Romberg is absent.   DIAGNOSTIC DATA (LABS, IMAGING, TESTING) - I reviewed patient records, labs, notes, testing and imaging myself where available.   ASSESSMENT AND PLAN  Carla Massey is a 54 y.o. female   Bilateral lower extremity intermittent paresthesia, stiffness, achy pain  Cervical MRI showed multilevel degenerative disc disease most severe at C 5-6, with mild to moderate canal stenosis, left foraminal stenosis,  Anemia  Iron deficiency anemia due to previous heavy menstrual bleeding  Laboratory showed low ferritin, she is on iron supplement now   Small jaw, snoring, daytime fatigue, sleepiness    Advised her continue moderate exercise, weight loss,   Marcial Pacas, M.D. Ph.D.  [facility] [address], [place], [place] [address] Ph: [phone] Fax: [phone]  CC: Sharilyn Sites, MD

## 2015-11-01 ENCOUNTER — Ambulatory Visit (HOSPITAL_COMMUNITY)
Admission: RE | Admit: 2015-11-01 | Discharge: 2015-11-01 | Disposition: A | Discharge status: Home / Self Care | Payer: 59 | Source: Ambulatory Visit | Attending: Physician Assistant | Admitting: Physician Assistant

## 2015-11-01 DIAGNOSIS — R110 Nausea: Secondary | ICD-10-CM | POA: Diagnosis not present

## 2015-11-01 DIAGNOSIS — K8020 Calculus of gallbladder without cholecystitis without obstruction: Secondary | ICD-10-CM | POA: Insufficient documentation

## 2015-11-01 DIAGNOSIS — R112 Nausea with vomiting, unspecified: Secondary | ICD-10-CM

## 2015-11-04 ENCOUNTER — Ambulatory Visit (HOSPITAL_COMMUNITY): Payer: 59

## 2015-11-15 ENCOUNTER — Encounter: Payer: Self-pay | Admitting: Internal Medicine

## 2015-11-15 ENCOUNTER — Ambulatory Visit (AMBULATORY_SURGERY_CENTER): Payer: 59 | Admitting: Internal Medicine

## 2015-11-15 VITALS — BP 127/101 | HR 62 | Temp 97.5°F | Resp 16 | Ht 61.0 in | Wt 137.0 lb

## 2015-11-15 DIAGNOSIS — R110 Nausea: Secondary | ICD-10-CM | POA: Diagnosis not present

## 2015-11-15 DIAGNOSIS — K449 Diaphragmatic hernia without obstruction or gangrene: Secondary | ICD-10-CM | POA: Diagnosis not present

## 2015-11-15 MED ORDER — SODIUM CHLORIDE 0.9 % IV SOLN: 500.0000 mL | INTRAVENOUS | Status: DC

## 2015-11-15 MED ORDER — MIRTAZAPINE 15 MG PO TABS: 15.0000 mg | ORAL_TABLET | Freq: Every day | ORAL | Status: DC

## 2015-11-15 NOTE — Patient Instructions (Addendum)
You have a small hiatal hernia but everything else looks great.  No cause of problems seen here.  The nausea could be coming from stress, worry, anxiety - even if not aware of those things.  There is a medication called mirtazapine which helps insomnia and nausea - You can consider taking that. Dr. Hilma Favors could prescribe it if you want to try it.  I do not think you need any other gastrointestinal testing.  I appreciate the opportunity to care for you. Gatha Mayer, MD, FACG   YOU HAD AN ENDOSCOPIC PROCEDURE TODAY AT [facility]:   Refer to the procedure report that was given to you for any specific questions about what was found during the examination.  If the procedure report does not answer your questions, please call your gastroenterologist to clarify.  If you requested that your care partner not be given the details of your procedure findings, then the procedure report has been included in a sealed envelope for you to review at your convenience later.  YOU SHOULD EXPECT: Some feelings of bloating in the abdomen. Passage of more gas than usual.  Walking can help get rid of the air that was put into your GI tract during the procedure and reduce the bloating. If you had a lower endoscopy (such as a colonoscopy or flexible sigmoidoscopy) you may notice spotting of blood in your stool or on the toilet paper. If you underwent a bowel prep for your procedure, you may not have a normal bowel movement for a few days.  Please Note:  You might notice some irritation and congestion in your nose or some drainage.  This is from the oxygen used during your procedure.  There is no need for concern and it should clear up in a day or so.  SYMPTOMS TO REPORT IMMEDIATELY:     Following upper endoscopy (EGD)  Vomiting of blood or coffee ground material  New chest pain or pain under the shoulder blades  Painful or persistently difficult swallowing  New shortness of  breath  Fever of 100F or higher  Black, tarry-looking stools  For urgent or emergent issues, a gastroenterologist can be reached at any hour by calling [phone].   DIET: Your first meal following the procedure should be a small meal and then it is ok to progress to your normal diet. Heavy or fried foods are harder to digest and may make you feel nauseous or bloated.  Likewise, meals heavy in dairy and vegetables can increase bloating.  Drink plenty of fluids but you should avoid alcoholic beverages for 24 hours.  ACTIVITY:  You should plan to take it easy for the rest of today and you should NOT DRIVE or use heavy machinery until tomorrow (because of the sedation medicines used during the test).    FOLLOW UP: Our staff will call the number listed on your records the next business day following your procedure to check on you and address any questions or concerns that you may have regarding the information given to you following your procedure. If we do not reach you, we will leave a message.  However, if you are feeling well and you are not experiencing any problems, there is no need to return our call.  We will assume that you have returned to your regular daily activities without incident.  If any biopsies were taken you will be contacted by phone or by letter within the next 1-3 weeks.  Please call us  at [phone] if you have not heard about the biopsies in 3 weeks.    SIGNATURES/CONFIDENTIALITY: You and/or your care partner have signed paperwork which will be entered into your electronic medical record.  These signatures attest to the fact that that the information above on your After Visit Summary has been reviewed and is understood.  Full responsibility of the confidentiality of this discharge information lies with you and/or your care-partner.  Remeron RX sent to your pharmacy ([facility] [place]) FOR YOU TO PICK UP REMERON TO TAKE IN PLACE OF LUNESTA  DR GESSNER DOES NOT  THINK YOU NEED THE ZANTAC UNLESS HAVING HEARTBURN ,

## 2015-11-15 NOTE — Op Note (Signed)
[place]  [address]. [place], [id number]   ENDOSCOPY PROCEDURE REPORT  PATIENT: Massey, Carla  MR#: [id number] BIRTHDATE: [birth year] , 54  yrs. old GENDER: female ENDOSCOPIST: Gatha Mayer, MD, [facility] PROCEDURE DATE:  11/15/2015 PROCEDURE:  EGD, diagnostic ASA CLASS:     Class II INDICATIONS:  nausea. MEDICATIONS: Propofol 160 mg IV and Monitored anesthesia care TOPICAL ANESTHETIC: none  DESCRIPTION OF PROCEDURE: After the risks benefits and alternatives of the procedure were thoroughly explained, informed consent was obtained.  The [id number] endoscope was introduced through the mouth and advanced to the second portion of the duodenum , Without limitations.  The instrument was slowly withdrawn as the mucosa was fully examined.    Small hiatal hernia otherwise normal EGD.  Retroflexed views revealed a hiatal hernia.     The scope was then withdrawn from the patient and the procedure completed.  COMPLICATIONS: There were no immediate complications.  ENDOSCOPIC IMPRESSION: Small hiatal hernia otherwise normal EGD  RECOMMENDATIONS: Do not think nausea from GI problem - would consider mirtazapine qhs to Tx nausea and insomnia.  no further GI eval needed   eSigned:  Gatha Mayer, MD, [facility] 11/15/2015 11:32 AM    CC:dr. Sharilyn Sites  and The Patient

## 2015-11-15 NOTE — Progress Notes (Signed)
To recovery, report to [place], RN, VSS 

## 2015-11-18 ENCOUNTER — Telehealth: Payer: Self-pay

## 2015-11-18 NOTE — Telephone Encounter (Signed)
  Follow up Call-  Call back number 11/15/2015  Post procedure Call Back phone  # [phone]  Permission to leave phone message Yes     No answer at the number given. Left a message for patient to call if she had any questions or concerns.

## 2015-12-18 ENCOUNTER — Encounter: Payer: 59 | Admitting: Internal Medicine

## 2016-09-14 ENCOUNTER — Other Ambulatory Visit: Payer: Self-pay | Admitting: Obstetrics and Gynecology

## 2016-09-14 DIAGNOSIS — E2839 Other primary ovarian failure: Secondary | ICD-10-CM

## 2016-09-16 ENCOUNTER — Other Ambulatory Visit: Payer: Self-pay | Admitting: Obstetrics and Gynecology

## 2016-09-16 DIAGNOSIS — Z1231 Encounter for screening mammogram for malignant neoplasm of breast: Secondary | ICD-10-CM

## 2016-10-14 ENCOUNTER — Ambulatory Visit
Admission: RE | Admit: 2016-10-14 | Discharge: 2016-10-14 | Disposition: A | Discharge status: Home / Self Care | Payer: 59 | Source: Ambulatory Visit | Attending: Obstetrics and Gynecology | Admitting: Obstetrics and Gynecology

## 2016-10-14 DIAGNOSIS — Z1231 Encounter for screening mammogram for malignant neoplasm of breast: Secondary | ICD-10-CM

## 2016-10-14 DIAGNOSIS — E2839 Other primary ovarian failure: Secondary | ICD-10-CM

## 2017-02-03 DIAGNOSIS — L309 Dermatitis, unspecified: Secondary | ICD-10-CM | POA: Diagnosis not present

## 2017-02-03 DIAGNOSIS — Z23 Encounter for immunization: Secondary | ICD-10-CM | POA: Diagnosis not present

## 2017-04-20 DIAGNOSIS — R030 Elevated blood-pressure reading, without diagnosis of hypertension: Secondary | ICD-10-CM | POA: Diagnosis not present

## 2017-05-13 DIAGNOSIS — E782 Mixed hyperlipidemia: Secondary | ICD-10-CM | POA: Diagnosis not present

## 2017-06-23 DIAGNOSIS — M5033 Other cervical disc degeneration, cervicothoracic region: Secondary | ICD-10-CM | POA: Diagnosis not present

## 2017-06-23 DIAGNOSIS — M9901 Segmental and somatic dysfunction of cervical region: Secondary | ICD-10-CM | POA: Diagnosis not present

## 2017-06-24 DIAGNOSIS — M5033 Other cervical disc degeneration, cervicothoracic region: Secondary | ICD-10-CM | POA: Diagnosis not present

## 2017-06-24 DIAGNOSIS — M9901 Segmental and somatic dysfunction of cervical region: Secondary | ICD-10-CM | POA: Diagnosis not present

## 2017-06-28 DIAGNOSIS — M9901 Segmental and somatic dysfunction of cervical region: Secondary | ICD-10-CM | POA: Diagnosis not present

## 2017-06-28 DIAGNOSIS — M5033 Other cervical disc degeneration, cervicothoracic region: Secondary | ICD-10-CM | POA: Diagnosis not present

## 2017-06-29 DIAGNOSIS — M9901 Segmental and somatic dysfunction of cervical region: Secondary | ICD-10-CM | POA: Diagnosis not present

## 2017-06-29 DIAGNOSIS — M5033 Other cervical disc degeneration, cervicothoracic region: Secondary | ICD-10-CM | POA: Diagnosis not present

## 2017-06-30 DIAGNOSIS — M5033 Other cervical disc degeneration, cervicothoracic region: Secondary | ICD-10-CM | POA: Diagnosis not present

## 2017-06-30 DIAGNOSIS — M9901 Segmental and somatic dysfunction of cervical region: Secondary | ICD-10-CM | POA: Diagnosis not present

## 2017-07-12 DIAGNOSIS — M5033 Other cervical disc degeneration, cervicothoracic region: Secondary | ICD-10-CM | POA: Diagnosis not present

## 2017-07-12 DIAGNOSIS — M9901 Segmental and somatic dysfunction of cervical region: Secondary | ICD-10-CM | POA: Diagnosis not present

## 2017-07-13 DIAGNOSIS — M5033 Other cervical disc degeneration, cervicothoracic region: Secondary | ICD-10-CM | POA: Diagnosis not present

## 2017-07-13 DIAGNOSIS — M9901 Segmental and somatic dysfunction of cervical region: Secondary | ICD-10-CM | POA: Diagnosis not present

## 2017-07-14 DIAGNOSIS — M9901 Segmental and somatic dysfunction of cervical region: Secondary | ICD-10-CM | POA: Diagnosis not present

## 2017-07-14 DIAGNOSIS — M5033 Other cervical disc degeneration, cervicothoracic region: Secondary | ICD-10-CM | POA: Diagnosis not present

## 2017-07-15 DIAGNOSIS — M5033 Other cervical disc degeneration, cervicothoracic region: Secondary | ICD-10-CM | POA: Diagnosis not present

## 2017-07-15 DIAGNOSIS — M9901 Segmental and somatic dysfunction of cervical region: Secondary | ICD-10-CM | POA: Diagnosis not present

## 2017-07-19 DIAGNOSIS — M5033 Other cervical disc degeneration, cervicothoracic region: Secondary | ICD-10-CM | POA: Diagnosis not present

## 2017-07-19 DIAGNOSIS — M9901 Segmental and somatic dysfunction of cervical region: Secondary | ICD-10-CM | POA: Diagnosis not present

## 2017-07-21 DIAGNOSIS — M9901 Segmental and somatic dysfunction of cervical region: Secondary | ICD-10-CM | POA: Diagnosis not present

## 2017-07-21 DIAGNOSIS — M5033 Other cervical disc degeneration, cervicothoracic region: Secondary | ICD-10-CM | POA: Diagnosis not present

## 2017-07-22 DIAGNOSIS — M5033 Other cervical disc degeneration, cervicothoracic region: Secondary | ICD-10-CM | POA: Diagnosis not present

## 2017-07-22 DIAGNOSIS — M9901 Segmental and somatic dysfunction of cervical region: Secondary | ICD-10-CM | POA: Diagnosis not present

## 2017-07-26 DIAGNOSIS — M9901 Segmental and somatic dysfunction of cervical region: Secondary | ICD-10-CM | POA: Diagnosis not present

## 2017-07-26 DIAGNOSIS — M5033 Other cervical disc degeneration, cervicothoracic region: Secondary | ICD-10-CM | POA: Diagnosis not present

## 2017-07-28 DIAGNOSIS — M5033 Other cervical disc degeneration, cervicothoracic region: Secondary | ICD-10-CM | POA: Diagnosis not present

## 2017-07-28 DIAGNOSIS — M9901 Segmental and somatic dysfunction of cervical region: Secondary | ICD-10-CM | POA: Diagnosis not present

## 2017-07-29 DIAGNOSIS — M5033 Other cervical disc degeneration, cervicothoracic region: Secondary | ICD-10-CM | POA: Diagnosis not present

## 2017-07-29 DIAGNOSIS — M9901 Segmental and somatic dysfunction of cervical region: Secondary | ICD-10-CM | POA: Diagnosis not present

## 2017-08-02 DIAGNOSIS — M5033 Other cervical disc degeneration, cervicothoracic region: Secondary | ICD-10-CM | POA: Diagnosis not present

## 2017-08-02 DIAGNOSIS — M9901 Segmental and somatic dysfunction of cervical region: Secondary | ICD-10-CM | POA: Diagnosis not present

## 2017-08-03 DIAGNOSIS — M9901 Segmental and somatic dysfunction of cervical region: Secondary | ICD-10-CM | POA: Diagnosis not present

## 2017-08-03 DIAGNOSIS — M5033 Other cervical disc degeneration, cervicothoracic region: Secondary | ICD-10-CM | POA: Diagnosis not present

## 2017-08-09 DIAGNOSIS — M5033 Other cervical disc degeneration, cervicothoracic region: Secondary | ICD-10-CM | POA: Diagnosis not present

## 2017-08-09 DIAGNOSIS — M9901 Segmental and somatic dysfunction of cervical region: Secondary | ICD-10-CM | POA: Diagnosis not present

## 2017-08-10 DIAGNOSIS — M5033 Other cervical disc degeneration, cervicothoracic region: Secondary | ICD-10-CM | POA: Diagnosis not present

## 2017-08-10 DIAGNOSIS — M9901 Segmental and somatic dysfunction of cervical region: Secondary | ICD-10-CM | POA: Diagnosis not present

## 2017-08-11 DIAGNOSIS — M9901 Segmental and somatic dysfunction of cervical region: Secondary | ICD-10-CM | POA: Diagnosis not present

## 2017-08-11 DIAGNOSIS — M5033 Other cervical disc degeneration, cervicothoracic region: Secondary | ICD-10-CM | POA: Diagnosis not present

## 2017-09-20 ENCOUNTER — Other Ambulatory Visit: Payer: Self-pay | Admitting: Obstetrics and Gynecology

## 2017-09-20 DIAGNOSIS — Z1231 Encounter for screening mammogram for malignant neoplasm of breast: Secondary | ICD-10-CM

## 2017-09-27 DIAGNOSIS — M9901 Segmental and somatic dysfunction of cervical region: Secondary | ICD-10-CM | POA: Diagnosis not present

## 2017-09-27 DIAGNOSIS — M5033 Other cervical disc degeneration, cervicothoracic region: Secondary | ICD-10-CM | POA: Diagnosis not present

## 2017-09-29 DIAGNOSIS — M5033 Other cervical disc degeneration, cervicothoracic region: Secondary | ICD-10-CM | POA: Diagnosis not present

## 2017-09-29 DIAGNOSIS — M9901 Segmental and somatic dysfunction of cervical region: Secondary | ICD-10-CM | POA: Diagnosis not present

## 2017-10-06 DIAGNOSIS — Z01419 Encounter for gynecological examination (general) (routine) without abnormal findings: Secondary | ICD-10-CM | POA: Diagnosis not present

## 2017-10-20 ENCOUNTER — Ambulatory Visit
Admission: RE | Admit: 2017-10-20 | Discharge: 2017-10-20 | Disposition: A | Discharge status: Home / Self Care | Payer: 59 | Source: Ambulatory Visit | Attending: Obstetrics and Gynecology | Admitting: Obstetrics and Gynecology

## 2017-10-20 DIAGNOSIS — Z1231 Encounter for screening mammogram for malignant neoplasm of breast: Secondary | ICD-10-CM

## 2017-10-25 DIAGNOSIS — Z23 Encounter for immunization: Secondary | ICD-10-CM | POA: Diagnosis not present

## 2017-12-16 DIAGNOSIS — M9901 Segmental and somatic dysfunction of cervical region: Secondary | ICD-10-CM | POA: Diagnosis not present

## 2017-12-16 DIAGNOSIS — M5033 Other cervical disc degeneration, cervicothoracic region: Secondary | ICD-10-CM | POA: Diagnosis not present

## 2017-12-20 DIAGNOSIS — M5033 Other cervical disc degeneration, cervicothoracic region: Secondary | ICD-10-CM | POA: Diagnosis not present

## 2017-12-20 DIAGNOSIS — M9901 Segmental and somatic dysfunction of cervical region: Secondary | ICD-10-CM | POA: Diagnosis not present

## 2017-12-27 DIAGNOSIS — M5033 Other cervical disc degeneration, cervicothoracic region: Secondary | ICD-10-CM | POA: Diagnosis not present

## 2017-12-27 DIAGNOSIS — M9901 Segmental and somatic dysfunction of cervical region: Secondary | ICD-10-CM | POA: Diagnosis not present

## 2018-01-03 DIAGNOSIS — M5033 Other cervical disc degeneration, cervicothoracic region: Secondary | ICD-10-CM | POA: Diagnosis not present

## 2018-01-03 DIAGNOSIS — M9901 Segmental and somatic dysfunction of cervical region: Secondary | ICD-10-CM | POA: Diagnosis not present

## 2018-01-10 DIAGNOSIS — M9901 Segmental and somatic dysfunction of cervical region: Secondary | ICD-10-CM | POA: Diagnosis not present

## 2018-01-10 DIAGNOSIS — M5033 Other cervical disc degeneration, cervicothoracic region: Secondary | ICD-10-CM | POA: Diagnosis not present

## 2018-01-11 DIAGNOSIS — Z23 Encounter for immunization: Secondary | ICD-10-CM | POA: Diagnosis not present

## 2018-01-17 DIAGNOSIS — M5033 Other cervical disc degeneration, cervicothoracic region: Secondary | ICD-10-CM | POA: Diagnosis not present

## 2018-01-17 DIAGNOSIS — M9901 Segmental and somatic dysfunction of cervical region: Secondary | ICD-10-CM | POA: Diagnosis not present

## 2018-01-27 DIAGNOSIS — M5033 Other cervical disc degeneration, cervicothoracic region: Secondary | ICD-10-CM | POA: Diagnosis not present

## 2018-01-27 DIAGNOSIS — M9901 Segmental and somatic dysfunction of cervical region: Secondary | ICD-10-CM | POA: Diagnosis not present

## 2018-01-31 DIAGNOSIS — M9901 Segmental and somatic dysfunction of cervical region: Secondary | ICD-10-CM | POA: Diagnosis not present

## 2018-01-31 DIAGNOSIS — M5033 Other cervical disc degeneration, cervicothoracic region: Secondary | ICD-10-CM | POA: Diagnosis not present

## 2018-02-07 DIAGNOSIS — M5033 Other cervical disc degeneration, cervicothoracic region: Secondary | ICD-10-CM | POA: Diagnosis not present

## 2018-02-07 DIAGNOSIS — M9901 Segmental and somatic dysfunction of cervical region: Secondary | ICD-10-CM | POA: Diagnosis not present

## 2018-02-14 DIAGNOSIS — M5033 Other cervical disc degeneration, cervicothoracic region: Secondary | ICD-10-CM | POA: Diagnosis not present

## 2018-02-14 DIAGNOSIS — M9901 Segmental and somatic dysfunction of cervical region: Secondary | ICD-10-CM | POA: Diagnosis not present

## 2018-02-21 DIAGNOSIS — M5033 Other cervical disc degeneration, cervicothoracic region: Secondary | ICD-10-CM | POA: Diagnosis not present

## 2018-02-21 DIAGNOSIS — M9901 Segmental and somatic dysfunction of cervical region: Secondary | ICD-10-CM | POA: Diagnosis not present

## 2018-02-28 DIAGNOSIS — M5033 Other cervical disc degeneration, cervicothoracic region: Secondary | ICD-10-CM | POA: Diagnosis not present

## 2018-02-28 DIAGNOSIS — M9901 Segmental and somatic dysfunction of cervical region: Secondary | ICD-10-CM | POA: Diagnosis not present

## 2018-03-09 DIAGNOSIS — M9901 Segmental and somatic dysfunction of cervical region: Secondary | ICD-10-CM | POA: Diagnosis not present

## 2018-03-09 DIAGNOSIS — M5033 Other cervical disc degeneration, cervicothoracic region: Secondary | ICD-10-CM | POA: Diagnosis not present

## 2018-03-16 DIAGNOSIS — M9901 Segmental and somatic dysfunction of cervical region: Secondary | ICD-10-CM | POA: Diagnosis not present

## 2018-03-16 DIAGNOSIS — M5033 Other cervical disc degeneration, cervicothoracic region: Secondary | ICD-10-CM | POA: Diagnosis not present

## 2018-03-21 DIAGNOSIS — R5383 Other fatigue: Secondary | ICD-10-CM | POA: Diagnosis not present

## 2018-03-21 DIAGNOSIS — E782 Mixed hyperlipidemia: Secondary | ICD-10-CM | POA: Diagnosis not present

## 2018-03-23 DIAGNOSIS — M9901 Segmental and somatic dysfunction of cervical region: Secondary | ICD-10-CM | POA: Diagnosis not present

## 2018-03-23 DIAGNOSIS — M5033 Other cervical disc degeneration, cervicothoracic region: Secondary | ICD-10-CM | POA: Diagnosis not present

## 2018-03-28 DIAGNOSIS — M5033 Other cervical disc degeneration, cervicothoracic region: Secondary | ICD-10-CM | POA: Diagnosis not present

## 2018-03-28 DIAGNOSIS — M9901 Segmental and somatic dysfunction of cervical region: Secondary | ICD-10-CM | POA: Diagnosis not present

## 2018-04-04 DIAGNOSIS — M9901 Segmental and somatic dysfunction of cervical region: Secondary | ICD-10-CM | POA: Diagnosis not present

## 2018-04-04 DIAGNOSIS — M5033 Other cervical disc degeneration, cervicothoracic region: Secondary | ICD-10-CM | POA: Diagnosis not present

## 2018-04-05 DIAGNOSIS — R5383 Other fatigue: Secondary | ICD-10-CM | POA: Diagnosis not present

## 2018-04-05 DIAGNOSIS — Z1389 Encounter for screening for other disorder: Secondary | ICD-10-CM | POA: Diagnosis not present

## 2018-04-05 DIAGNOSIS — E782 Mixed hyperlipidemia: Secondary | ICD-10-CM | POA: Diagnosis not present

## 2018-04-11 DIAGNOSIS — M5033 Other cervical disc degeneration, cervicothoracic region: Secondary | ICD-10-CM | POA: Diagnosis not present

## 2018-04-11 DIAGNOSIS — M9901 Segmental and somatic dysfunction of cervical region: Secondary | ICD-10-CM | POA: Diagnosis not present

## 2018-04-18 DIAGNOSIS — M5033 Other cervical disc degeneration, cervicothoracic region: Secondary | ICD-10-CM | POA: Diagnosis not present

## 2018-04-18 DIAGNOSIS — M9901 Segmental and somatic dysfunction of cervical region: Secondary | ICD-10-CM | POA: Diagnosis not present

## 2018-04-25 DIAGNOSIS — M9901 Segmental and somatic dysfunction of cervical region: Secondary | ICD-10-CM | POA: Diagnosis not present

## 2018-04-25 DIAGNOSIS — M5033 Other cervical disc degeneration, cervicothoracic region: Secondary | ICD-10-CM | POA: Diagnosis not present

## 2018-05-04 ENCOUNTER — Encounter: Payer: 59 | Admitting: Obstetrics and Gynecology

## 2018-05-12 IMAGING — MG 2D DIGITAL SCREENING BILATERAL MAMMOGRAM WITH CAD AND ADJUNCT TO
8 of 12 series · 8 of 28 positions shown · non-contrast
Comparison: Previous exam(s).

CLINICAL DATA: Screening.

EXAM:
2D DIGITAL SCREENING BILATERAL MAMMOGRAM WITH CAD AND ADJUNCT TOMO

[L MLO synth-2D]
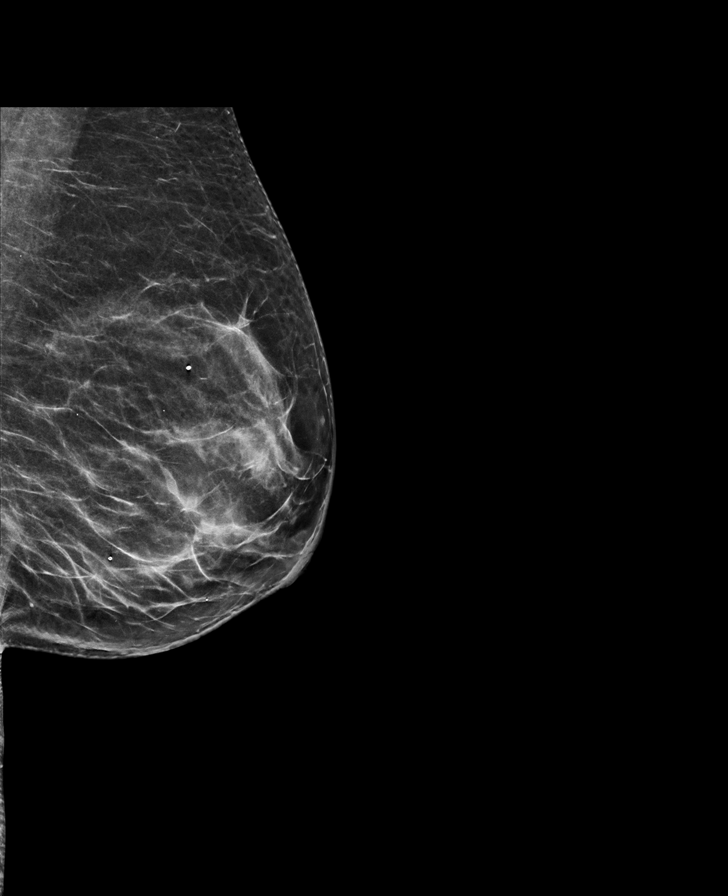

[R CC synth-2D]
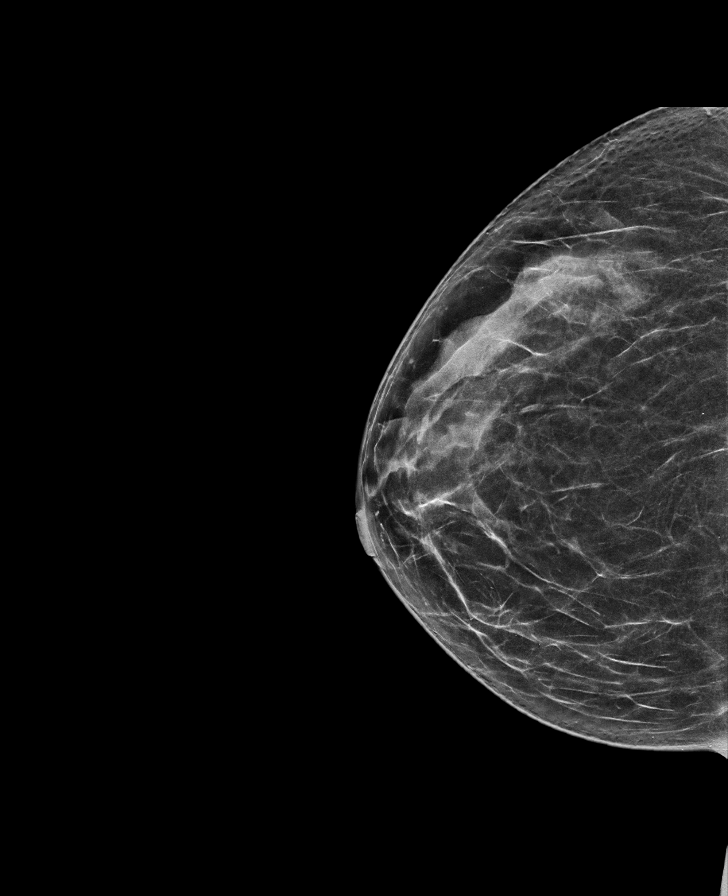

[L MLO]
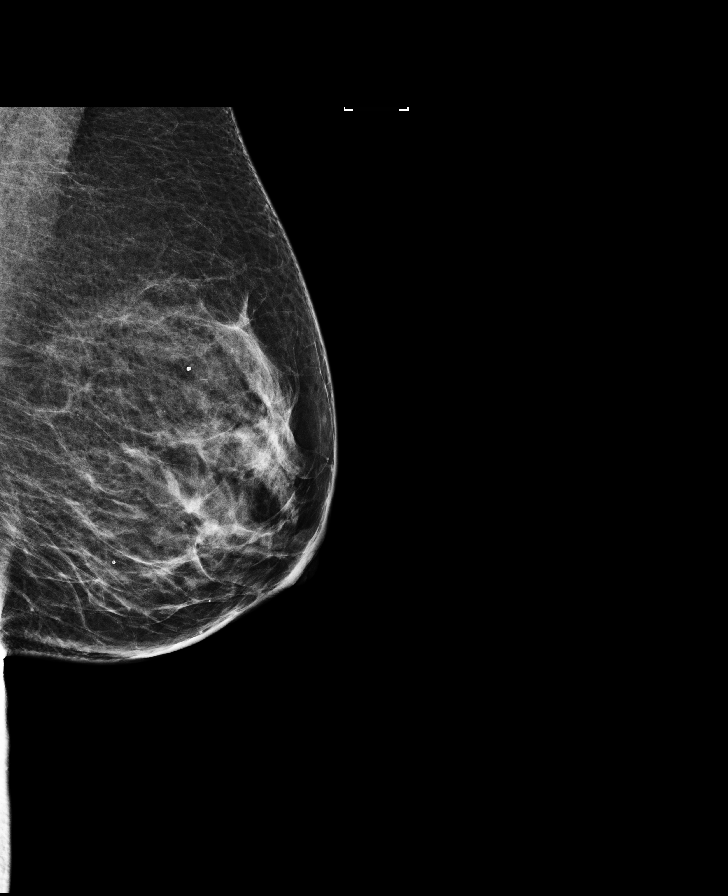

[L CC]
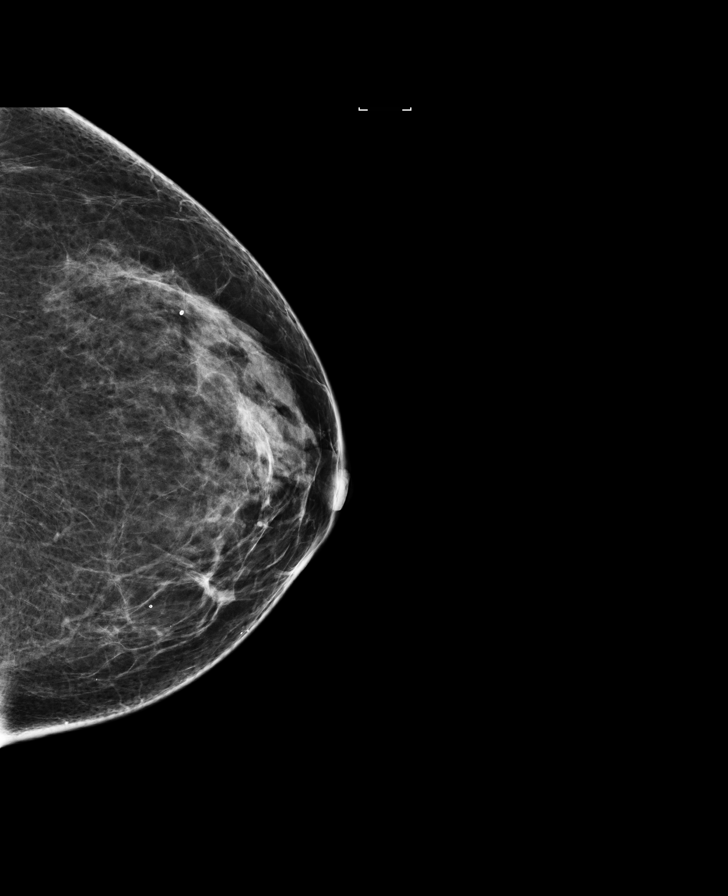

[R MLO synth-2D]
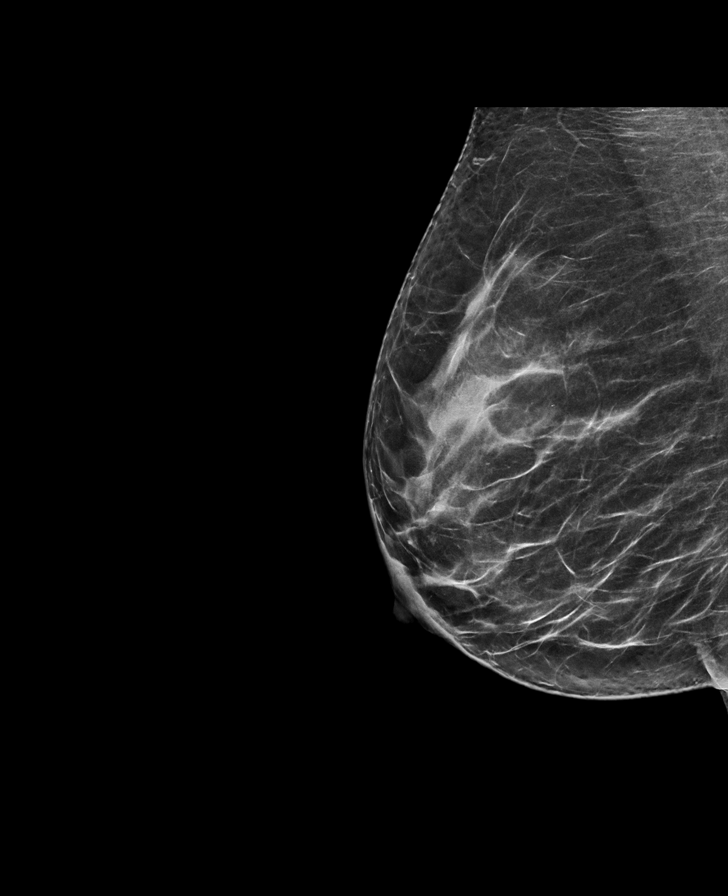

[L CC synth-2D]
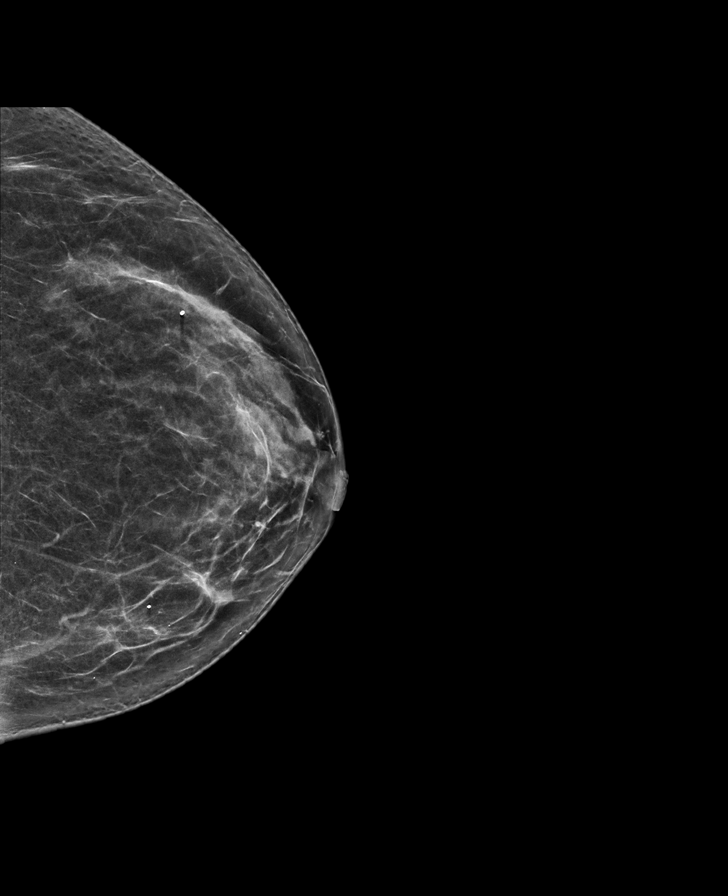

[R CC]
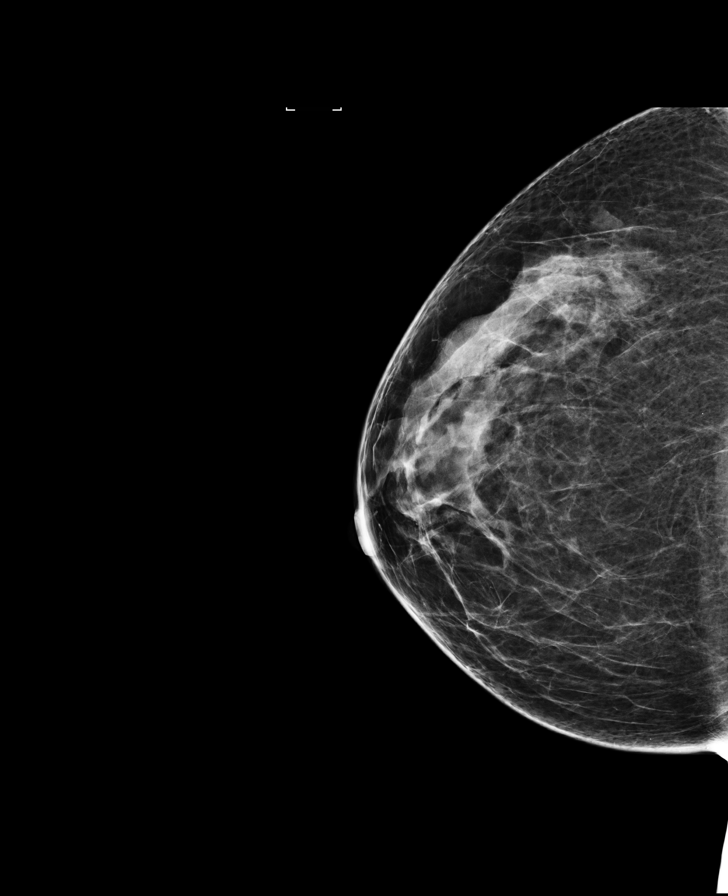

[R MLO]
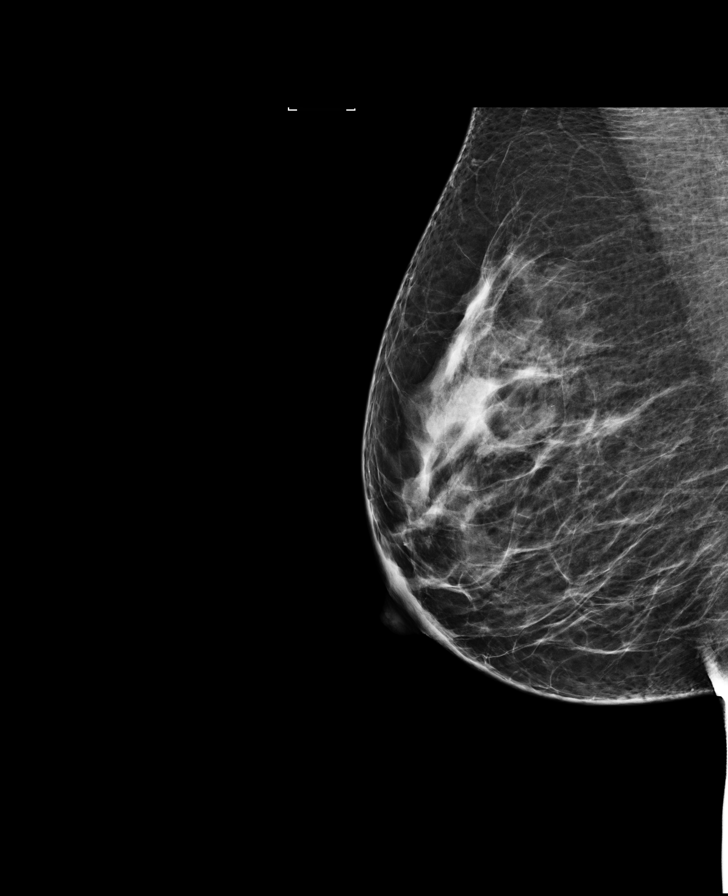

[8 of 28 positions shown; findings below may reference images not displayed]

ACR Breast Density Category b: There are scattered areas of
fibroglandular density.
FINDINGS: There are no findings suspicious for malignancy. Images were
processed with CAD.
IMPRESSION: No mammographic evidence of malignancy. A result letter of this
screening mammogram will be mailed directly to the patient.

RECOMMENDATION:
Screening mammogram in one year. (Code:97-6-RS4)

BI-RADS CATEGORY  1: Negative.

## 2018-05-23 DIAGNOSIS — M5033 Other cervical disc degeneration, cervicothoracic region: Secondary | ICD-10-CM | POA: Diagnosis not present

## 2018-05-23 DIAGNOSIS — M9901 Segmental and somatic dysfunction of cervical region: Secondary | ICD-10-CM | POA: Diagnosis not present

## 2018-06-27 DIAGNOSIS — M5033 Other cervical disc degeneration, cervicothoracic region: Secondary | ICD-10-CM | POA: Diagnosis not present

## 2018-06-27 DIAGNOSIS — M9901 Segmental and somatic dysfunction of cervical region: Secondary | ICD-10-CM | POA: Diagnosis not present

## 2018-09-09 DIAGNOSIS — I10 Essential (primary) hypertension: Secondary | ICD-10-CM | POA: Diagnosis not present

## 2018-09-09 DIAGNOSIS — Z6824 Body mass index (BMI) 24.0-24.9, adult: Secondary | ICD-10-CM | POA: Diagnosis not present

## 2018-11-18 ENCOUNTER — Other Ambulatory Visit: Payer: Self-pay | Admitting: Obstetrics and Gynecology

## 2018-11-18 DIAGNOSIS — Z1231 Encounter for screening mammogram for malignant neoplasm of breast: Secondary | ICD-10-CM

## 2018-12-30 ENCOUNTER — Ambulatory Visit
Admission: RE | Admit: 2018-12-30 | Discharge: 2018-12-30 | Disposition: A | Discharge status: Home / Self Care | Payer: Managed Care, Other (non HMO) | Source: Ambulatory Visit | Attending: Obstetrics and Gynecology | Admitting: Obstetrics and Gynecology

## 2018-12-30 DIAGNOSIS — Z1231 Encounter for screening mammogram for malignant neoplasm of breast: Secondary | ICD-10-CM

## 2020-01-04 ENCOUNTER — Other Ambulatory Visit: Payer: Self-pay | Admitting: Family Medicine

## 2020-01-04 DIAGNOSIS — Z1231 Encounter for screening mammogram for malignant neoplasm of breast: Secondary | ICD-10-CM

## 2020-01-05 ENCOUNTER — Other Ambulatory Visit: Payer: Self-pay

## 2020-01-05 ENCOUNTER — Ambulatory Visit
Admission: RE | Admit: 2020-01-05 | Discharge: 2020-01-05 | Disposition: A | Discharge status: Home / Self Care | Payer: Managed Care, Other (non HMO) | Source: Ambulatory Visit

## 2020-01-05 DIAGNOSIS — Z1231 Encounter for screening mammogram for malignant neoplasm of breast: Secondary | ICD-10-CM

## 2020-06-27 ENCOUNTER — Ambulatory Visit: Payer: Managed Care, Other (non HMO) | Attending: Neurosurgery | Admitting: Physical Therapy

## 2020-06-27 ENCOUNTER — Other Ambulatory Visit: Payer: Self-pay

## 2020-06-27 DIAGNOSIS — M6281 Muscle weakness (generalized): Secondary | ICD-10-CM | POA: Insufficient documentation

## 2020-06-27 DIAGNOSIS — M542 Cervicalgia: Secondary | ICD-10-CM | POA: Insufficient documentation

## 2020-06-27 DIAGNOSIS — M545 Low back pain: Secondary | ICD-10-CM | POA: Diagnosis present

## 2020-06-27 DIAGNOSIS — G8929 Other chronic pain: Secondary | ICD-10-CM | POA: Diagnosis present

## 2020-06-27 NOTE — Patient Instructions (Signed)
Access Code: [email or web address]: [email or web address]: 07/15/2021Prepared by: Anderson Malta PaaExercises  Supine Transversus Abdominis Bracing - Hands on Stomach - 2 x daily - 7 x weekly - 2 sets - 10 reps - 5 hold  Hooklying Clamshell with Resistance - 2 x daily - 7 x weekly - 2 sets - 10 reps - 5 hold  Kneeling Plank with Feet on Ground - 2 x daily - 7 x weekly - 1 sets - 3 reps - 30 hold

## 2020-06-28 ENCOUNTER — Encounter: Payer: Self-pay | Admitting: Physical Therapy

## 2020-06-28 NOTE — Therapy (Signed)
[place], [place], [address] Phone: [phone]   Fax:  [phone]  Physical Therapy Evaluation  Patient Details  Name: Carla Massey MRN: [id number] Date of Birth: 1961/08/14 Referring Provider (PT): Dr. Newman Pies   Encounter Date: 06/27/2020   PT End of Session - 06/28/20 0753    Visit Number 1    Number of Visits 8    Date for PT Re-Evaluation 08/22/20    Authorization Type [employer]    PT Start Time 1534    PT Stop Time 1625    PT Time Calculation (min) 51 min           Past Medical History:  Diagnosis Date  . Allergic rhinitis   . Anemia   . Depression    history  . Esophageal reflux   . Hiatal hernia   . Insomnia   . Motility disorder, esophageal   . Muscle weakness   . Seasonal allergies   . SVD (spontaneous vaginal delivery)    x 1    Past Surgical History:  Procedure Laterality Date  . BILATERAL SALPINGECTOMY Bilateral 05/21/2015   Procedure: BILATERAL SALPINGECTOMY;  Surgeon: Vanessa Kick, MD;  Location: [facility];  Service: Gynecology;  Laterality: Bilateral;  . COLONOSCOPY    . LAPAROSCOPIC ASSISTED VAGINAL HYSTERECTOMY N/A 05/21/2015   Procedure: LAPAROSCOPIC ASSISTED VAGINAL HYSTERECTOMY;  Surgeon: Vanessa Kick, MD;  Location: [facility];  Service: Gynecology;  Laterality: N/A;  . UPPER GI ENDOSCOPY    . WISDOM TOOTH EXTRACTION      There were no vitals filed for this visit.    Subjective Assessment - 06/27/20 1539    Subjective Patient has chronic neck and low back pain.  L side of low back is most bothersome.  Pain radiates to L LE to the knee.  She notices her knee starts burning when she keeps her knees crossed.  Rarely her LLE goes numb. LLE feels weak.  Pain stops at the knee.  Feels swollen and inflamed all the time.  Her symptoms impact her ability to exercise.    Limitations Sitting;Walking;Lifting;House hold activities;Other (comment)    How long can you sit comfortably?  worse if she sits, very uncomfortable    How long can you stand comfortably? Prefers to stand, does not increase her pain    How long can you walk comfortably? Can walk 3 miles 3 x per week.    Diagnostic tests MRI long ago , none recent    Patient Stated Goals Patient would like to do simple things without pain    Currently in Pain? Yes    Pain Score 3     Pain Location Neck    Pain Orientation Left;Right;Posterior    Pain Descriptors / Indicators Burning;Numbness    Pain Type Chronic pain    Pain Onset More than a month ago    Pain Frequency Intermittent    Aggravating Factors  being up , working    Pain Relieving Factors not sure    Effect of Pain on Daily Activities always uncomfortable    Multiple Pain Sites Yes    Pain Score 5    Pain Location Back    Pain Orientation Left;Lower    Pain Descriptors / Indicators Dull;Sore    Pain Type Chronic pain    Pain Radiating Towards LLE    Pain Onset More than a month ago    Pain Frequency Constant    Aggravating Factors  sitting, lifting  Pain Relieving Factors nothing really    Effect of Pain on Daily Activities worried something is wrong, wants to avoid surgery              [facility] PT Assessment - 06/28/20 0001      Assessment   Medical Diagnosis neck and chronic back pain     Referring Provider (PT) Dr. Newman Pies    Onset Date/Surgical Date --   chronic , 3 yrs    Hand Dominance Right    Prior Therapy no       Precautions   Precautions None      Restrictions   Weight Bearing Restrictions No      Balance Screen   Has the patient fallen in the past 6 months No      [place] residence    Living Arrangements Spouse/significant other    Home Layout One level;Laundry or work area in basement      Prior Function   Level of Independence Independent    Vocation Full time employment    Vocation Requirements mostly standing     Leisure [employer], family/grandbabies        Cognition   Overall Cognitive Status Within Functional Limits for tasks assessed      Sensation   Light Touch Appears Intact      Functional Tests   Functional tests Squat;Single leg stance      Squat   Comments heels lift, poor hinge       Single Leg Stance   Comments weaker, less stable on L , mild trunk lean       Posture/Postural Control   Posture/Postural Control Postural limitations    Postural Limitations Rounded Shoulders;Forward head      AROM   Cervical Flexion 35    Cervical Extension 85    Cervical - Right Side Bend 34    Cervical - Left Side Bend 38    Cervical - Right Rotation WNL     Cervical - Left Rotation WNL     Lumbar Flexion pain L side WFL    Lumbar Extension WFL felt good     Lumbar - Right Side Bend WFL    Lumbar - Left Side Bend WFL     Lumbar - Right Rotation WFL    Lumbar - Left Rotation WFL       Strength   Right Hand Grip (lbs) 46. 7 lbs    Left Hand Grip (lbs) 42 lbs     Right Hip Flexion 3+/5    Right Hip ABduction 4+/5    Left Hip Flexion 3+/5    Left Hip ABduction 4+/5    Right Knee Flexion 5/5    Right Knee Extension 5/5    Left Knee Flexion 5/5    Left Knee Extension 5/5    Right Ankle Dorsiflexion 5/5    Left Ankle Dorsiflexion 5/5      Flexibility   Hamstrings 80 deg     Quadriceps WFL     Piriformis WFL      Palpation   Spinal mobility mild hypermobility in L2-L3, L3-L4, L4-L5    Palpation comment Painful Lt low lumbar, lateral to sacrum       Special Tests    Special Tests Cervical;Lumbar      Distraction Test   Comment Felt good pt on LLE       Straight Leg Raise   Findings Negative    Comment bilateral  Ambulation/Gait   Gait Comments no deviations with gross check              Objective measurements completed on examination: See above findings.      PT Education - 06/28/20 0751    Education Details PT/POC, normal MRI finding with aging, core, joint mobility    Person(s) Educated Patient     Methods Explanation;Demonstration;Tactile cues;Handout;Verbal cues    Comprehension Verbalized understanding;Returned demonstration;Need further instruction            PT Short Term Goals - 06/28/20 1240      PT SHORT TERM GOAL #1   Title Pt will be I with HEP for core stabilization, posture    Time 4    Period Weeks    Status New    Target Date 07/25/20      PT SHORT TERM GOAL #2   Title 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    Time 4    Period Weeks    Status New    Target Date 07/25/20      PT SHORT TERM GOAL #3   Title Pt will understand FOTO results and potential to achieve target    Time 1    Period Weeks    Status New    Target Date 07/05/20             PT Long Term Goals - 06/28/20 1246      PT LONG TERM GOAL #1   Title Pt wil be I with final HEP upon discharge    Time 8    Period Weeks    Status New    Target Date 08/22/20      PT LONG TERM GOAL #2   Title Pt will improve FOTO score to <25% on neck and back to demo improved functional mobility    Time 8    Period Weeks    Status New    Target Date 08/22/20      PT LONG TERM GOAL #3   Title Pt will be able to work a full shift with min increase in back and neck pain, eased with stretching and rest.    Time 8    Period Weeks    Status New    Target Date 08/22/20      PT LONG TERM GOAL #4   Title Pt will be able to reduce pain with sitting for 1 hour for travel, work to none, most of the time.    Time 8    Period Weeks    Status New    Target Date 08/22/20      PT LONG TERM GOAL #5   Title Pt will increase hip strength to 5/5 in flexion, abduction and extension for improved stability and mechanics with gait    Time 8    Period Weeks    Status New    Target Date 08/22/20                  Plan - 06/28/20 1254    Clinical Impression Statement Patient presetns for mod complexity eval of neck and low back pain which has been increasing  over the past few years.  She has now developed some radiating symptoms into her legs and some knee pain which prompted her to seek PT.  She shows weakness in core and hips and mild hypermobility in lumbosacral spine.  She is active  and does yoga at the [facility] but certain things make her pain increase.  But burning in knees may be relates to joint inflammtion or tightness, does not seem related to her back. Less time spent on cervical spine but will reassess next visit.    Personal Factors and Comorbidities Time since onset of injury/illness/exacerbation    Examination-Activity Limitations Sit;Bend;Lift;Carry;Locomotion Level;Squat    Examination-Participation Restrictions Interpersonal Relationship;Cleaning;Laundry;Community Activity    Stability/Clinical Decision Making Evolving/Moderate complexity    Clinical Decision Making Moderate    Rehab Potential Excellent    PT Frequency 1x / week    PT Duration 8 weeks    PT Treatment/Interventions ADLs/Self Care Home Management;Cryotherapy;Electrical Stimulation;Moist Heat;Traction;Therapeutic exercise;Balance training;Neuromuscular re-education;Patient/family education;Manual techniques;Dry needling;Taping;Passive range of motion    PT Next Visit Plan check C spine, UE strength , HEP    PT Home Exercise Plan TrA , banded clam, mod plank    Consulted and Agree with Plan of Care Patient           Patient will benefit from skilled therapeutic intervention in order to improve the following deficits and impairments:  Decreased mobility, Hypermobility, Increased fascial restricitons, Decreased strength, Pain, Decreased range of motion  Visit Diagnosis: Cervicalgia  Chronic bilateral low back pain without sciatica  Muscle weakness (generalized)     Problem List Patient Active Problem List   Diagnosis Date Noted  . Anemia, iron deficiency 10/30/2015  . Cervical spondylosis without myelopathy 10/30/2015  . Status post laparoscopic assisted  vaginal hysterectomy (LAVH) 05/21/2015  . Esophageal reflux 05/15/2011  . Raynauds phenomenon 05/15/2011  . Chest pain, mid sternal 05/15/2011    Carla Massey 06/28/2020, 1:04 PM  [facility] [address] [place], [place], [address] Phone: [phone]   Fax:  [phone]  Name: Carla Massey MRN: [id number] Date of Birth: 04/13/1961   Raeford Razor, PT 06/28/20 1:05 PM Phone: [phone] Fax: [phone]

## 2020-08-01 ENCOUNTER — Ambulatory Visit: Payer: Managed Care, Other (non HMO) | Admitting: Physical Therapy

## 2020-08-08 ENCOUNTER — Encounter: Payer: Self-pay | Admitting: Physical Therapy

## 2020-08-08 ENCOUNTER — Other Ambulatory Visit: Payer: Self-pay

## 2020-08-08 ENCOUNTER — Ambulatory Visit: Payer: Managed Care, Other (non HMO) | Attending: Neurosurgery | Admitting: Physical Therapy

## 2020-08-08 DIAGNOSIS — G8929 Other chronic pain: Secondary | ICD-10-CM | POA: Diagnosis present

## 2020-08-08 DIAGNOSIS — M542 Cervicalgia: Secondary | ICD-10-CM | POA: Diagnosis present

## 2020-08-08 DIAGNOSIS — M545 Low back pain: Secondary | ICD-10-CM | POA: Insufficient documentation

## 2020-08-08 DIAGNOSIS — M6281 Muscle weakness (generalized): Secondary | ICD-10-CM | POA: Insufficient documentation

## 2020-08-08 NOTE — Patient Instructions (Signed)
Access Code: [email or web address]: [email or web address]: 08/26/2021Prepared by: Anderson Malta PaaExercises  Supine Chin Tuck - 2 x daily - 7 x weekly - 2 sets - 10 reps - 5 hold  Supine Scapular Retraction - 2 x daily - 7 x weekly - 2 sets - 10 reps - 5 hold  Supine Lower Trunk Rotation - 2 x daily - 7 x weekly - 2 sets - 10 reps - 10 hold  Supine Single Knee to Chest Stretch - 2 x daily - 7 x weekly - 2 sets - 10 reps - 30 hold  Supine Transversus Abdominis Bracing - Hands on Stomach - 2 x daily - 7 x weekly - 2 sets - 10 reps - 5 hold  Supine Bridge - 2 x daily - 7 x weekly - 2 sets - 10 reps - 5 hold  Hooklying Single Leg Bent Knee Fallouts with Resistance - 2 x daily - 7 x weekly - 2 sets - 10 reps - 5 hold

## 2020-08-08 NOTE — Therapy (Signed)
[place], [place], [address] Phone: [phone]   Fax:  [phone]  Physical Therapy Treatment  Patient Details  Name: Carla Massey MRN: [id number] Date of Birth: 1961/09/08 Referring Provider (PT): Dr. Newman Pies   Encounter Date: 08/08/2020   PT End of Session - 08/08/20 1448    Visit Number 2    Number of Visits 8    Date for PT Re-Evaluation 08/22/20    Authorization Type [employer]    PT Start Time 1445    PT Stop Time 1530    PT Time Calculation (min) 45 min    Activity Tolerance Patient tolerated treatment well    Behavior During Therapy [facility] for tasks assessed/performed           Past Medical History:  Diagnosis Date  . Allergic rhinitis   . Anemia   . Depression    history  . Esophageal reflux   . Hiatal hernia   . Insomnia   . Motility disorder, esophageal   . Muscle weakness   . Seasonal allergies   . SVD (spontaneous vaginal delivery)    x 1    Past Surgical History:  Procedure Laterality Date  . BILATERAL SALPINGECTOMY Bilateral 05/21/2015   Procedure: BILATERAL SALPINGECTOMY;  Surgeon: Vanessa Kick, MD;  Location: [facility];  Service: Gynecology;  Laterality: Bilateral;  . COLONOSCOPY    . LAPAROSCOPIC ASSISTED VAGINAL HYSTERECTOMY N/A 05/21/2015   Procedure: LAPAROSCOPIC ASSISTED VAGINAL HYSTERECTOMY;  Surgeon: Vanessa Kick, MD;  Location: [facility];  Service: Gynecology;  Laterality: N/A;  . UPPER GI ENDOSCOPY    . WISDOM TOOTH EXTRACTION      There were no vitals filed for this visit.   Subjective Assessment - 08/08/20 1447    Subjective My neck and back is so bad.  got COVID, went on vacation after that. returns for 1st treatment.    Currently in Pain? Yes    Pain Score 5     Pain Location Back    Pain Orientation Left;Lower    Pain Descriptors / Indicators Aching    Pain Type Chronic pain    Pain Onset More than a month ago    Pain Frequency Intermittent    Aggravating  Factors  activity, working (reception at dental)    Pain Relieving Factors meds    Multiple Pain Sites Yes    Pain Score 5    Pain Location Neck    Pain Orientation Right;Left;Upper    Pain Descriptors / Indicators Burning    Pain Type Chronic pain    Pain Onset More than a month ago    Pain Frequency Constant              [facility] Adult PT Treatment/Exercise - 08/08/20 0001      Neck Exercises: Stabilization   Stabilization chin tuck, band pull blue/horizonal abd       Lumbar Exercises: Stretches   Single Knee to Chest Stretch 2 reps;30 seconds    Single Knee to Chest Stretch Limitations add rotation to each side     Lower Trunk Rotation Limitations x 10 with head turns       Lumbar Exercises: Supine   Ab Set 10 reps    Pelvic Tilt 10 reps    Pelvic Tilt Limitations with breathing     Clam 15 reps    Clam Limitations blue band unilateral x 10    bilateral      Manual Therapy  Manual Therapy Soft tissue mobilization;Passive ROM    Soft tissue mobilization suboccipitals, cervical paraspinals, upper trap     Passive ROM rotaion and lateral fllexion                   PT Education - 08/08/20 2024    Education Details neck, core HEP, symptom mgmt    Person(s) Educated Patient    Methods Explanation;Demonstration;Handout    Comprehension Verbalized understanding;Returned demonstration            PT Short Term Goals - 08/08/20 1511      PT SHORT TERM GOAL #1   Title Pt will be I with HEP for core stabilization, posture    Status On-going      PT SHORT TERM GOAL #2   Title 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    Status On-going      PT SHORT TERM GOAL #3   Title Pt will understand FOTO results and potential to achieve target    Status On-going             PT Long Term Goals - 08/08/20 1511      PT LONG TERM GOAL #1   Title Pt wil be I with final HEP upon discharge    Status On-going      PT LONG  TERM GOAL #2   Title Pt will improve FOTO score to <25% on neck and back to demo improved functional mobility    Status On-going      PT LONG TERM GOAL #3   Title Pt will be able to work a full shift with min increase in back and neck pain, eased with stretching and rest.    Status On-going      PT LONG TERM GOAL #4   Title Pt will be able to reduce pain with sitting for 1 hour for travel, work to none, most of the time.    Status On-going      PT LONG TERM GOAL #5   Title Pt will increase hip strength to 5/5 in flexion, abduction and extension for improved stability and mechanics with gait    Status On-going                 Plan - 08/08/20 1502    Clinical Impression Statement Patient has missed several weeks due to timing of scheduling, illness than vacation.  She reports doing her exercises but wihout change in symptoms.  Her pain is now both her neck and her back, whereas prior it was one or the other. We worked on stabilization and some trunk stretching, offered manual to relieve tension in neck and uppoer back.  Felt relief in neck post session.    PT Treatment/Interventions ADLs/Self Care Home Management;Cryotherapy;Electrical Stimulation;Moist Heat;Traction;Therapeutic exercise;Balance training;Neuromuscular re-education;Patient/family education;Manual techniques;Dry needling;Taping;Passive range of motion    PT Next Visit Plan check HEP, UE strength, core and hips.  Nustpe vs UBE, manual    PT Home Exercise Plan TrA , banded clam, mod plank, chin tuck, LTR, knee to chest banded bridge    Consulted and Agree with Plan of Care Patient           Patient will benefit from skilled therapeutic intervention in order to improve the following deficits and impairments:  Decreased mobility, Hypermobility, Increased fascial restricitons, Decreased strength, Pain, Decreased range of motion  Visit Diagnosis: Cervicalgia  Chronic bilateral low back pain  without sciatica  Muscle  weakness (generalized)     Problem List Patient Active Problem List   Diagnosis Date Noted  . Anemia, iron deficiency 10/30/2015  . Cervical spondylosis without myelopathy 10/30/2015  . Status post laparoscopic assisted vaginal hysterectomy (LAVH) 05/21/2015  . Esophageal reflux 05/15/2011  . Raynauds phenomenon 05/15/2011  . Chest pain, mid sternal 05/15/2011    Addie Alonge 08/08/2020, 8:31 PM  [facility] [address] [place], [place], [address] Phone: [phone]   Fax:  [phone]  Name: Carla Massey MRN: [id number] Date of Birth: 1961/10/25  Raeford Razor, PT 08/08/20 8:32 PM Phone: [phone] Fax: [phone]

## 2020-08-09 ENCOUNTER — Ambulatory Visit: Payer: Managed Care, Other (non HMO) | Admitting: Physical Therapy

## 2020-08-09 ENCOUNTER — Other Ambulatory Visit: Payer: Self-pay

## 2020-08-09 DIAGNOSIS — M6281 Muscle weakness (generalized): Secondary | ICD-10-CM

## 2020-08-09 DIAGNOSIS — G8929 Other chronic pain: Secondary | ICD-10-CM

## 2020-08-09 DIAGNOSIS — M542 Cervicalgia: Secondary | ICD-10-CM

## 2020-08-09 NOTE — Therapy (Signed)
[place], [place], [address] Phone: [phone]   Fax:  [phone]  Physical Therapy Treatment  Patient Details  Name: Carla Massey MRN: [id number] Date of Birth: [birth year] Referring Provider (PT): Dr. Newman Pies   Encounter Date: 08/09/2020   PT End of Session - 08/09/20 0837    Visit Number 3    Number of Visits 8    Date for PT Re-Evaluation 08/22/20    Authorization Type [employer]    PT Start Time 0830    PT Stop Time 0915    PT Time Calculation (min) 45 min    Activity Tolerance Patient tolerated treatment well    Behavior During Therapy [facility] for tasks assessed/performed           Past Medical History:  Diagnosis Date  . Allergic rhinitis   . Anemia   . Depression    history  . Esophageal reflux   . Hiatal hernia   . Insomnia   . Motility disorder, esophageal   . Muscle weakness   . Seasonal allergies   . SVD (spontaneous vaginal delivery)    x 1    Past Surgical History:  Procedure Laterality Date  . BILATERAL SALPINGECTOMY Bilateral 05/21/2015   Procedure: BILATERAL SALPINGECTOMY;  Surgeon: Vanessa Kick, MD;  Location: [facility];  Service: Gynecology;  Laterality: Bilateral;  . COLONOSCOPY    . LAPAROSCOPIC ASSISTED VAGINAL HYSTERECTOMY N/A 05/21/2015   Procedure: LAPAROSCOPIC ASSISTED VAGINAL HYSTERECTOMY;  Surgeon: Vanessa Kick, MD;  Location: [facility];  Service: Gynecology;  Laterality: N/A;  . UPPER GI ENDOSCOPY    . WISDOM TOOTH EXTRACTION      There were no vitals filed for this visit.   Subjective Assessment - 08/09/20 0841    Subjective About the same today, 5/10 neck and back.  The relief lasted about an hour yesterday after the massage.    Currently in Pain? Yes    Pain Score 5     Pain Location Back   and neck            [facility] Adult PT Treatment/Exercise - 08/09/20 0001      Transfers   Five time sit to stand comments  10.1 sec     Comments 30 sec SLS done 20 reps        Lumbar Exercises: Stretches   Lower Trunk Rotation Limitations x 10 with head turns       Lumbar Exercises: Aerobic   Nustep 7 min UE and LE with L3       Lumbar Exercises: Standing   Functional Squats 10 reps    Functional Squats Limitations 3 sets     Lifting Weights (lbs) 10 hip hinge, used dowel , x 3 sets with min cues       Lumbar Exercises: Supine   Advanced Lumbar Stabilization Limitations clam bilateral and unilateral , then march unilateral and alternating  about 8-10 reps each     Other Supine Lumbar Exercises foam roller: UE flex/ext and horiztonal abd x 10      Manual Therapy   Manual Therapy Manual Traction    Soft tissue mobilization suboccipitals, cervical paraspinals, upper trap     Manual Traction gentle cervical                  PT Education - 08/09/20 0844    Education Details foam roller stability    Person(s) Educated Patient    Methods Explanation  Comprehension Verbalized understanding            PT Short Term Goals - 08/08/20 1511      PT SHORT TERM GOAL #1   Title Pt will be I with HEP for core stabilization, posture    Status On-going      PT SHORT TERM GOAL #2   Title 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    Status On-going      PT SHORT TERM GOAL #3   Title Pt will understand FOTO results and potential to achieve target    Status On-going             PT Long Term Goals - 08/08/20 1511      PT LONG TERM GOAL #1   Title Pt wil be I with final HEP upon discharge    Status On-going      PT LONG TERM GOAL #2   Title Pt will improve FOTO score to <25% on neck and back to demo improved functional mobility    Status On-going      PT LONG TERM GOAL #3   Title Pt will be able to work a full shift with min increase in back and neck pain, eased with stretching and rest.    Status On-going      PT LONG TERM GOAL #4   Title Pt will be able to reduce pain with sitting for  1 hour for travel, work to none, most of the time.    Status On-going      PT LONG TERM GOAL #5   Title Pt will increase hip strength to 5/5 in flexion, abduction and extension for improved stability and mechanics with gait    Status On-going                 Plan - 08/09/20 0837    Clinical Impression Statement Used Foam roller to challenge awareness and core stability.  She needed min cues for stability in lower core. No increased pain.  No knee pain /burning with squat and dead lifts.  Symptoms in knees may be more nerve related than joint.    PT Treatment/Interventions ADLs/Self Care Home Management;Cryotherapy;Electrical Stimulation;Moist Heat;Traction;Therapeutic exercise;Balance training;Neuromuscular re-education;Patient/family education;Manual techniques;Dry needling;Taping;Passive range of motion    PT Next Visit Plan core, hip strength and stability.  UBE. Manual considet traction when symptoms radicular    PT Home Exercise Plan TrA , banded clam, mod plank, chin tuck, LTR, knee to chest banded bridge    Consulted and Agree with Plan of Care Patient           Patient will benefit from skilled therapeutic intervention in order to improve the following deficits and impairments:  Decreased mobility, Hypermobility, Increased fascial restricitons, Decreased strength, Pain, Decreased range of motion  Visit Diagnosis: Cervicalgia  Chronic bilateral low back pain without sciatica  Muscle weakness (generalized)     Problem List Patient Active Problem List   Diagnosis Date Noted  . Anemia, iron deficiency 10/30/2015  . Cervical spondylosis without myelopathy 10/30/2015  . Status post laparoscopic assisted vaginal hysterectomy (LAVH) 05/21/2015  . Esophageal reflux 05/15/2011  . Raynauds phenomenon 05/15/2011  . Chest pain, mid sternal 05/15/2011    Belkis Norbeck 08/09/2020, 10:43 AM  [facility] [address] [place], [place], [address] Phone: [phone]   Fax:  [phone]  Name: HAVEN PYLANT MRN: [id number] Date of  Birth: 11-01-1961  Raeford Razor, PT 08/09/20 10:43 AM Phone: [phone] Fax: [phone]

## 2020-08-22 ENCOUNTER — Ambulatory Visit: Payer: Managed Care, Other (non HMO) | Admitting: Physical Therapy

## 2020-08-23 ENCOUNTER — Encounter: Payer: Self-pay | Admitting: Physical Therapy

## 2020-08-23 ENCOUNTER — Ambulatory Visit: Payer: Managed Care, Other (non HMO) | Attending: Neurosurgery | Admitting: Physical Therapy

## 2020-08-23 ENCOUNTER — Other Ambulatory Visit: Payer: Self-pay

## 2020-08-23 DIAGNOSIS — M6281 Muscle weakness (generalized): Secondary | ICD-10-CM

## 2020-08-23 DIAGNOSIS — M542 Cervicalgia: Secondary | ICD-10-CM | POA: Insufficient documentation

## 2020-08-23 DIAGNOSIS — G8929 Other chronic pain: Secondary | ICD-10-CM

## 2020-08-23 DIAGNOSIS — M545 Low back pain: Secondary | ICD-10-CM | POA: Insufficient documentation

## 2020-08-23 NOTE — Therapy (Signed)
[place], [place], [address] Phone: [phone]   Fax:  [phone]  Physical Therapy Treatment/Renewal  Patient Details  Name: Carla Massey MRN: [id number] Date of Birth: July 20, 1961 Referring Provider (PT): Dr. Newman Pies   Encounter Date: 08/23/2020   PT End of Session - 08/23/20 1145    Visit Number 4    Number of Visits 12    Date for PT Re-Evaluation 10/04/20    Authorization Type [employer]    PT Start Time 1135    PT Stop Time 1220    PT Time Calculation (min) 45 min    Activity Tolerance Patient tolerated treatment well    Behavior During Therapy [facility] for tasks assessed/performed           Past Medical History:  Diagnosis Date  . Allergic rhinitis   . Anemia   . Depression    history  . Esophageal reflux   . Hiatal hernia   . Insomnia   . Motility disorder, esophageal   . Muscle weakness   . Seasonal allergies   . SVD (spontaneous vaginal delivery)    x 1    Past Surgical History:  Procedure Laterality Date  . BILATERAL SALPINGECTOMY Bilateral 05/21/2015   Procedure: BILATERAL SALPINGECTOMY;  Surgeon: Vanessa Kick, MD;  Location: [facility];  Service: Gynecology;  Laterality: Bilateral;  . COLONOSCOPY    . LAPAROSCOPIC ASSISTED VAGINAL HYSTERECTOMY N/A 05/21/2015   Procedure: LAPAROSCOPIC ASSISTED VAGINAL HYSTERECTOMY;  Surgeon: Vanessa Kick, MD;  Location: [facility];  Service: Gynecology;  Laterality: N/A;  . UPPER GI ENDOSCOPY    . WISDOM TOOTH EXTRACTION      There were no vitals filed for this visit.   Subjective Assessment - 08/23/20 1139    Subjective I'm hurting along my L side of neck and my back is the same too.  L side 5/10.    Currently in Pain? Yes    Pain Score 5     Pain Location Back    Pain Orientation Left;Lower    Pain Descriptors / Indicators Aching    Pain Type Chronic pain    Pain Onset More than a month ago    Pain Frequency Intermittent    Pain Score 5     Pain Location Neck    Pain Orientation Left;Posterior;Lateral    Pain Descriptors / Indicators Aching    Pain Type Chronic pain                 [facility] Adult PT Treatment/Exercise - 08/23/20 0001      Lumbar Exercises: Aerobic   Nustep 7 min UE and LE with L3       Lumbar Exercises: Supine   Pelvic Tilt 10 reps    Pelvic Tilt Limitations with breathing       Lumbar Exercises: Prone   Straight Leg Raise 15 reps    Straight Leg Raises Limitations multifidus      Lumbar Exercises: Quadruped   Madcat/Old Horse 10 reps    Opposite Arm/Leg Raise Right arm/Left leg;Left arm/Right leg;10 reps    Other Quadruped Lumbar Exercises wags for sidebending    Other Quadruped Lumbar Exercises childs pose, thoracic rotation       Manual Therapy   Soft tissue mobilization Lt upper trap, levator scapula, pin and stretch , Tr P release       Neck Exercises: Stretches   Upper Trapezius Stretch Left;2 reps;30 seconds    Levator Stretch Left;2  reps;30 seconds                    PT Short Term Goals - 08/23/20 1208      PT SHORT TERM GOAL #1   Title Pt will be I with HEP for core stabilization, posture    Status Achieved      PT SHORT TERM GOAL #2   Title 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    Status Achieved      PT SHORT TERM GOAL #3   Title Pt will understand FOTO results and potential to achieve target    Status On-going             PT Long Term Goals - 08/23/20 1209      PT LONG TERM GOAL #1   Title Pt wil be I with final HEP upon discharge    Status On-going      PT LONG TERM GOAL #2   Title Pt will improve FOTO score to <25% on neck and back to demo improved functional mobility    Status On-going      PT LONG TERM GOAL #3   Title Pt will be able to work a full shift with min increase in back and neck pain, eased with stretching and rest.    Status On-going      PT LONG TERM GOAL #4   Title Pt will be  able to reduce pain with sitting for 1 hour for travel, work to none, most of the time.    Status On-going      PT LONG TERM GOAL #5   Title Pt will increase hip strength to 5/5 in flexion, abduction and extension for improved stability and mechanics with gait    Status On-going                 Plan - 08/23/20 1209    Clinical Impression Statement Patient continues to have constant pain, varying depending on work schedule. She has been doing her HEP, modifying her ergonomics, working in her posture.  She has mised visits due to illness and work conflicts.  Her POC was extended today to allow more visits, to include dry needling able.  Noted L sided multifidus weakness with prone extension exercises.    PT Frequency 2x / week    PT Duration 6 weeks   8 more visits   PT Treatment/Interventions ADLs/Self Care Home Management;Cryotherapy;Electrical Stimulation;Moist Heat;Traction;Therapeutic exercise;Balance training;Neuromuscular re-education;Patient/family education;Manual techniques;Dry needling;Taping;Passive range of motion    PT Next Visit Plan prone stability progression for L multifidus. core, hip strength and stability.  UBE. Manual considet traction when symptoms radicular    PT Home Exercise Plan TrA , banded clam, mod plank, chin tuck, LTR, knee to chest banded bridge, bird dog    Consulted and Agree with Plan of Care Patient           Patient will benefit from skilled therapeutic intervention in order to improve the following deficits and impairments:  Decreased mobility, Hypermobility, Increased fascial restricitons, Decreased strength, Pain, Decreased range of motion  Visit Diagnosis: Cervicalgia  Muscle weakness (generalized)  Chronic bilateral low back pain without sciatica     Problem List Patient Active Problem List   Diagnosis Date Noted  . Anemia, iron deficiency 10/30/2015  . Cervical spondylosis without myelopathy 10/30/2015  . Status post laparoscopic  assisted vaginal hysterectomy (LAVH) 05/21/2015  . Esophageal  reflux 05/15/2011  . Raynauds phenomenon 05/15/2011  . Chest pain, mid sternal 05/15/2011    Carla Massey 08/23/2020, 5:32 PM  [facility] [address] [place], [place], [address] Phone: [phone]   Fax:  [phone]  Name: Carla Massey MRN: [id number] Date of Birth: Sep 05, 1961  Raeford Razor, PT 08/23/20 5:32 PM Phone: [phone] Fax: [phone]

## 2020-08-23 NOTE — Addendum Note (Signed)
Addended by: Raeford Razor L on: 08/23/2020 05:35 PM   Modules accepted: Orders

## 2020-08-23 NOTE — Patient Instructions (Signed)

## 2020-08-29 ENCOUNTER — Other Ambulatory Visit: Payer: Self-pay

## 2020-08-29 ENCOUNTER — Ambulatory Visit: Payer: Managed Care, Other (non HMO) | Admitting: Physical Therapy

## 2020-08-29 ENCOUNTER — Encounter: Payer: Self-pay | Admitting: Physical Therapy

## 2020-08-29 DIAGNOSIS — M6281 Muscle weakness (generalized): Secondary | ICD-10-CM

## 2020-08-29 DIAGNOSIS — G8929 Other chronic pain: Secondary | ICD-10-CM

## 2020-08-29 DIAGNOSIS — M542 Cervicalgia: Secondary | ICD-10-CM

## 2020-08-29 DIAGNOSIS — M545 Low back pain, unspecified: Secondary | ICD-10-CM

## 2020-08-29 NOTE — Therapy (Signed)
[place], [place], [address] Phone: [phone]   Fax:  [phone]  Physical Therapy Treatment  Patient Details  Name: Carla Massey MRN: [id number] Date of Birth: April 03, 1961 Referring Provider (PT): Dr. Newman Pies   Encounter Date: 08/29/2020   PT End of Session - 08/29/20 1054    Visit Number 5    Number of Visits 12    Date for PT Re-Evaluation 10/04/20    Authorization Type [employer]    PT Start Time 1050    PT Stop Time 1138    PT Time Calculation (min) 48 min    Activity Tolerance Patient tolerated treatment well    Behavior During Therapy [facility] for tasks assessed/performed           Past Medical History:  Diagnosis Date  . Allergic rhinitis   . Anemia   . Depression    history  . Esophageal reflux   . Hiatal hernia   . Insomnia   . Motility disorder, esophageal   . Muscle weakness   . Seasonal allergies   . SVD (spontaneous vaginal delivery)    x 1    Past Surgical History:  Procedure Laterality Date  . BILATERAL SALPINGECTOMY Bilateral 05/21/2015   Procedure: BILATERAL SALPINGECTOMY;  Surgeon: Vanessa Kick, MD;  Location: [facility];  Service: Gynecology;  Laterality: Bilateral;  . COLONOSCOPY    . LAPAROSCOPIC ASSISTED VAGINAL HYSTERECTOMY N/A 05/21/2015   Procedure: LAPAROSCOPIC ASSISTED VAGINAL HYSTERECTOMY;  Surgeon: Vanessa Kick, MD;  Location: [facility];  Service: Gynecology;  Laterality: N/A;  . UPPER GI ENDOSCOPY    . WISDOM TOOTH EXTRACTION      There were no vitals filed for this visit.   Subjective Assessment - 08/29/20 1051    Subjective I had so much pain last night after work I could barely walk.  I have been doing my exercises.    Currently in Pain? Yes    Pain Score 3    none sitting   Pain Location Back    Pain Orientation Left;Lower    Pain Descriptors / Indicators Aching;Sore    Pain Type Chronic pain    Pain Onset More than a month ago    Pain Frequency  Intermittent    Pain Score 5    Pain Location Neck    Pain Orientation Posterior    Pain Descriptors / Indicators Aching;Sore    Pain Type Chronic pain              [facility] Adult PT Treatment/Exercise - 08/29/20 0001      Lumbar Exercises: Stretches   Single Knee to Chest Stretch 2 reps;30 seconds    Lower Trunk Rotation Limitations with single knee to chest x 2       Lumbar Exercises: Aerobic   Nustep 7 min UE and LE with L5      Lumbar Exercises: Prone   Straight Leg Raise 15 reps    Straight Leg Raises Limitations multifidus    Opposite Arm/Leg Raise Right arm/Left leg;Left arm/Right leg;10 reps    Other Prone Lumbar Exercises Transverse abdominus x 10     Other Prone Lumbar Exercises prone knee flexion x 10       Lumbar Exercises: Quadruped   Madcat/Old Horse --    Opposite Arm/Leg Raise Right arm/Left leg;Left arm/Right leg;10 reps    Opposite Arm/Leg Raise Limitations leg and arm lift x 10     Other Quadruped Lumbar Exercises childs pose,  thoracic rotation       Moist Heat Therapy   Number Minutes Moist Heat 15 Minutes    Moist Heat Location Lumbar Spine      Electrical Stimulation   Electrical Stimulation Location low back L side     Electrical Stimulation Action IFC     Electrical Stimulation Parameters to tolerance     Electrical Stimulation Goals Pain                    PT Short Term Goals - 08/23/20 1208      PT SHORT TERM GOAL #1   Title Pt will be I with HEP for core stabilization, posture    Status Achieved      PT SHORT TERM GOAL #2   Title 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    Status Achieved      PT SHORT TERM GOAL #3   Title Pt will understand FOTO results and potential to achieve target    Status On-going             PT Long Term Goals - 08/23/20 1209      PT LONG TERM GOAL #1   Title Pt wil be I with final HEP upon discharge    Status On-going      PT LONG TERM GOAL  #2   Title Pt will improve FOTO score to <25% on neck and back to demo improved functional mobility    Status On-going      PT LONG TERM GOAL #3   Title Pt will be able to work a full shift with min increase in back and neck pain, eased with stretching and rest.    Status On-going      PT LONG TERM GOAL #4   Title Pt will be able to reduce pain with sitting for 1 hour for travel, work to none, most of the time.    Status On-going      PT LONG TERM GOAL #5   Title Pt will increase hip strength to 5/5 in flexion, abduction and extension for improved stability and mechanics with gait    Status On-going                 Plan - 08/29/20 1506    Clinical Impression Statement Patient continues with pain, no lasting improvement in symptoms with HEP.  Given her info on core stability and addressed in prone and extended position. Also educated on TENS unit for home use and she may get one for future use.  Also interested in dry needling.    PT Treatment/Interventions ADLs/Self Care Home Management;Cryotherapy;Electrical Stimulation;Moist Heat;Traction;Therapeutic exercise;Balance training;Neuromuscular re-education;Patient/family education;Manual techniques;Dry needling;Taping;Passive range of motion    PT Next Visit Plan prone stability progression for L multifidus. core, hip strength and stability.  UBE. Manual considet traction when symptoms radicular    PT Home Exercise Plan TrA , banded clam, mod plank, chin tuck, LTR, knee to chest banded bridge, bird dog    Consulted and Agree with Plan of Care Patient           Patient will benefit from skilled therapeutic intervention in order to improve the following deficits and impairments:  Decreased mobility, Hypermobility, Increased fascial restricitons, Decreased strength, Pain, Decreased range of motion  Visit Diagnosis: Cervicalgia  Muscle weakness (generalized)  Chronic bilateral low back pain without sciatica     Problem  List Patient  Active Problem List   Diagnosis Date Noted  . Anemia, iron deficiency 10/30/2015  . Cervical spondylosis without myelopathy 10/30/2015  . Status post laparoscopic assisted vaginal hysterectomy (LAVH) 05/21/2015  . Esophageal reflux 05/15/2011  . Raynauds phenomenon 05/15/2011  . Chest pain, mid sternal 05/15/2011    Falicia Lizotte 08/29/2020, 3:11 PM  [facility] [address] [place], [place], [address] Phone: [phone]   Fax:  [phone]  Name: MAKIAH FOYE MRN: [id number] Date of Birth: [birth year]  Raeford Razor, PT 08/29/20 3:11 PM Phone: [phone] Fax: [phone]

## 2020-08-30 ENCOUNTER — Encounter: Payer: Self-pay | Admitting: Physical Therapy

## 2020-08-30 ENCOUNTER — Ambulatory Visit: Payer: Managed Care, Other (non HMO) | Admitting: Physical Therapy

## 2020-08-30 DIAGNOSIS — M542 Cervicalgia: Secondary | ICD-10-CM

## 2020-08-30 DIAGNOSIS — M6281 Muscle weakness (generalized): Secondary | ICD-10-CM

## 2020-08-30 DIAGNOSIS — G8929 Other chronic pain: Secondary | ICD-10-CM

## 2020-08-30 NOTE — Therapy (Signed)
[place], [place], [address] Phone: [phone]   Fax:  [phone]  Physical Therapy Treatment  Patient Details  Name: Carla Massey MRN: [id number] Date of Birth: 1961/10/11 Referring Provider (PT): Dr. Newman Pies   Encounter Date: 08/30/2020   PT End of Session - 08/30/20 1120    Visit Number 6    Number of Visits 12    Date for PT Re-Evaluation 10/04/20    Authorization Type [employer]    PT Start Time [id number]    PT Stop Time 0942   time spent for dry needling as well as estim with MHP not included in direct timed tx. minutes   PT Time Calculation (min) 56 min    Activity Tolerance Patient tolerated treatment well    Behavior During Therapy [facility] for tasks assessed/performed           Past Medical History:  Diagnosis Date  . Allergic rhinitis   . Anemia   . Depression    history  . Esophageal reflux   . Hiatal hernia   . Insomnia   . Motility disorder, esophageal   . Muscle weakness   . Seasonal allergies   . SVD (spontaneous vaginal delivery)    x 1    Past Surgical History:  Procedure Laterality Date  . BILATERAL SALPINGECTOMY Bilateral 05/21/2015   Procedure: BILATERAL SALPINGECTOMY;  Surgeon: Vanessa Kick, MD;  Location: [facility];  Service: Gynecology;  Laterality: Bilateral;  . COLONOSCOPY    . LAPAROSCOPIC ASSISTED VAGINAL HYSTERECTOMY N/A 05/21/2015   Procedure: LAPAROSCOPIC ASSISTED VAGINAL HYSTERECTOMY;  Surgeon: Vanessa Kick, MD;  Location: [facility];  Service: Gynecology;  Laterality: N/A;  . UPPER GI ENDOSCOPY    . WISDOM TOOTH EXTRACTION      There were no vitals filed for this visit.   Subjective Assessment - 08/30/20 0848    Subjective Pt. interested in trying TENS unit for neck today. She reports ordered TENS unit yesterday as well. She wishes to focus on neck today with report of pain 3/10 (more as burning sensation) in CT junction region with associated local  numbness. No UE  radiating symptoms. Low back is feeling a little better today. She is also interested in trying dry needling today.              [facility] PT Assessment - 08/30/20 0001      Special Tests   Other special tests Longsitting test (+) for left posterior innominate rotation                         [facility] Adult PT Treatment/Exercise - 08/30/20 0001      Exercises   Exercises Neck      Neck Exercises: Supine   Neck Retraction 20 reps    Neck Retraction Limitations with gentle therapist assistance for slight overpressure      Lumbar Exercises: Aerobic   Nustep L6 x 5 min UE/LE      Moist Heat Therapy   Number Minutes Moist Heat 10 Minutes    Moist Heat Location Cervical      Electrical Stimulation   Electrical Stimulation Location CT junction region paraspinals    Electrical Stimulation Action premod    Electrical Stimulation Parameters to tolerance x 10 min with MHP    Electrical Stimulation Goals Pain      Manual Therapy   Manual Therapy Joint mobilization;Muscle Energy Technique    Joint Mobilization Upper  thoracic/CT junction region PAs grade I-III    Soft tissue mobilization CT junction region paraspinals    Manual Traction cervical manual traction    Muscle Energy Technique Left hip flexion isometric with right hamstring isometric 5 x 5 sec ea. for correction of left posterior innominate rotation, "shotgun" MET 3 x 5 sec ea.            Trigger Point Dry Needling - 08/30/20 0001    Consent Given? Yes    Education Handout Provided Yes    Muscles Treated Head and Neck Cervical multifidi   and upper thoracic multifidi-see comments   Dry Needling Comments needling in prone to lower cervical and upper thoracic multifidi bilaterally from C7-T3 region with 30 gauge 30 mm needles                PT Education - 08/30/20 1120    Education Details dry needling, POC, SI anatomy    Person(s) Educated Patient    Methods Explanation;Handout    Comprehension  Verbalized understanding            PT Short Term Goals - 08/23/20 1208      PT SHORT TERM GOAL #1   Title Pt will be I with HEP for core stabilization, posture    Status Achieved      PT SHORT TERM GOAL #2   Title 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    Status Achieved      PT SHORT TERM GOAL #3   Title Pt will understand FOTO results and potential to achieve target    Status On-going             PT Long Term Goals - 08/23/20 1209      PT LONG TERM GOAL #1   Title Pt wil be I with final HEP upon discharge    Status On-going      PT LONG TERM GOAL #2   Title Pt will improve FOTO score to <25% on neck and back to demo improved functional mobility    Status On-going      PT LONG TERM GOAL #3   Title Pt will be able to work a full shift with min increase in back and neck pain, eased with stretching and rest.    Status On-going      PT LONG TERM GOAL #4   Title Pt will be able to reduce pain with sitting for 1 hour for travel, work to none, most of the time.    Status On-going      PT LONG TERM GOAL #5   Title Pt will increase hip strength to 5/5 in flexion, abduction and extension for improved stability and mechanics with gait    Status On-going                 Plan - 08/30/20 1123    Clinical Impression Statement Tx. focus cervical region today with manual therapy as noted per flowsheet, brief exercises for retractions as well as trial dry needling as noted per flowsheet all with good tolerance. Will await further tx. response by next session and progess/continue as tolerated. For lumbar region pt. had localized some symptoms to left SI region-check longsitting test with left posterior innominate rotation noted so included METs to address with repeat test (-) after. Progress otherwise with therapy goals ongoing.    Personal Factors and Comorbidities Time since onset of injury/illness/exacerbation  Examination-Activity Limitations Sit;Bend;Lift;Carry;Locomotion Level;Squat    Examination-Participation Restrictions Interpersonal Relationship;Cleaning;Laundry;Community Activity    Stability/Clinical Decision Making Evolving/Moderate complexity    Rehab Potential Excellent    PT Frequency 2x / week    PT Duration 6 weeks    PT Treatment/Interventions ADLs/Self Care Home Management;Cryotherapy;Electrical Stimulation;Moist Heat;Traction;Therapeutic exercise;Balance training;Neuromuscular re-education;Patient/family education;Manual techniques;Dry needling;Taping;Passive range of motion    PT Next Visit Plan Check response dry needling as well as METs for SI region and continue as needed/found beneficial, continue prone stabilization progression, hip and core strengthening, potential further manual traction for neck vs. hip distraction for left LE or trial mechanical traction prn    PT Home Exercise Plan TrA , banded clam, mod plank, chin tuck, LTR, knee to chest banded bridge, bird dog    Consulted and Agree with Plan of Care Patient           Patient will benefit from skilled therapeutic intervention in order to improve the following deficits and impairments:  Decreased mobility, Hypermobility, Increased fascial restricitons, Decreased strength, Pain, Decreased range of motion  Visit Diagnosis: Cervicalgia  Muscle weakness (generalized)  Chronic bilateral low back pain without sciatica     Problem List Patient Active Problem List   Diagnosis Date Noted  . Anemia, iron deficiency 10/30/2015  . Cervical spondylosis without myelopathy 10/30/2015  . Status post laparoscopic assisted vaginal hysterectomy (LAVH) 05/21/2015  . Esophageal reflux 05/15/2011  . Raynauds phenomenon 05/15/2011  . Chest pain, mid sternal 05/15/2011    Beaulah Dinning, PT, DPT 08/30/20 11:29 AM  [facility] [address] [place], [place],  [address] Phone: [phone]   Fax:  [phone]  Name: Carla Massey MRN: [id number] Date of Birth: [birth year]

## 2020-08-30 NOTE — Patient Instructions (Signed)

## 2020-09-26 ENCOUNTER — Ambulatory Visit: Payer: Managed Care, Other (non HMO) | Admitting: Physical Therapy

## 2020-09-27 ENCOUNTER — Ambulatory Visit: Payer: Managed Care, Other (non HMO) | Admitting: Physical Therapy

## 2020-10-11 ENCOUNTER — Ambulatory Visit: Payer: Managed Care, Other (non HMO) | Admitting: Physical Therapy

## 2020-10-18 ENCOUNTER — Encounter: Payer: Managed Care, Other (non HMO) | Admitting: Physical Therapy

## 2020-10-31 NOTE — Therapy (Signed)
[place], [place], [address] Phone: [phone]   Fax:  [phone]  Physical Therapy Treatment/Discharge  Patient Details  Name: Carla Massey MRN: [id number] Date of Birth: [birth year] Referring Provider (PT): Dr. Newman Pies   Encounter Date: 08/30/2020    Past Medical History:  Diagnosis Date   Allergic rhinitis    Anemia    Depression    history   Esophageal reflux    Hiatal hernia    Insomnia    Motility disorder, esophageal    Muscle weakness    Seasonal allergies    SVD (spontaneous vaginal delivery)    x 1    Past Surgical History:  Procedure Laterality Date   BILATERAL SALPINGECTOMY Bilateral 05/21/2015   Procedure: BILATERAL SALPINGECTOMY;  Surgeon: Vanessa Kick, MD;  Location: [facility];  Service: Gynecology;  Laterality: Bilateral;   COLONOSCOPY     LAPAROSCOPIC ASSISTED VAGINAL HYSTERECTOMY N/A 05/21/2015   Procedure: LAPAROSCOPIC ASSISTED VAGINAL HYSTERECTOMY;  Surgeon: Vanessa Kick, MD;  Location: [facility];  Service: Gynecology;  Laterality: N/A;   UPPER GI ENDOSCOPY     WISDOM TOOTH EXTRACTION      There were no vitals filed for this visit.                                PT Short Term Goals - 08/23/20 1208      PT SHORT TERM GOAL #1   Title Pt will be I with HEP for core stabilization, posture    Status Achieved      PT SHORT TERM GOAL #2   Title 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    Status Achieved      PT SHORT TERM GOAL #3   Title Pt will understand FOTO results and potential to achieve target    Status On-going             PT Long Term Goals - 08/23/20 1209      PT LONG TERM GOAL #1   Title Pt wil be I with final HEP upon discharge    Status On-going      PT LONG TERM GOAL #2   Title Pt will improve FOTO score to <25% on neck and back to demo improved functional  mobility    Status On-going      PT LONG TERM GOAL #3   Title Pt will be able to work a full shift with min increase in back and neck pain, eased with stretching and rest.    Status On-going      PT LONG TERM GOAL #4   Title Pt will be able to reduce pain with sitting for 1 hour for travel, work to none, most of the time.    Status On-going      PT LONG TERM GOAL #5   Title Pt will increase hip strength to 5/5 in flexion, abduction and extension for improved stability and mechanics with gait    Status On-going                  Patient will benefit from skilled therapeutic intervention in order to improve the following deficits and impairments:  Decreased mobility, Hypermobility, Increased fascial restricitons, Decreased strength, Pain, Decreased range of motion  Visit Diagnosis: Cervicalgia  Muscle weakness (generalized)  Chronic bilateral low back pain without  sciatica     Problem List Patient Active Problem List   Diagnosis Date Noted   Anemia, iron deficiency 10/30/2015   Cervical spondylosis without myelopathy 10/30/2015   Status post laparoscopic assisted vaginal hysterectomy (LAVH) 05/21/2015   Esophageal reflux 05/15/2011   Raynauds phenomenon 05/15/2011   Chest pain, mid sternal 05/15/2011        PHYSICAL THERAPY DISCHARGE SUMMARY  Visits from Start of Care: 6  Current functional level related to goals / functional outcomes: Patient did not return for further therapy after last visit 08/30/20   Remaining deficits: Current status unknown   Education / Equipment: HEP Plan: Patient agrees to discharge.  Patient goals were not met. Patient is being discharged due to not returning since the last visit.  ?????          Beaulah Dinning, PT, DPT 10/31/20 9:31 AM        [facility] [address] [place], [place], [address] Phone: [phone]   Fax:  [phone]  Name: KIELA SHISLER MRN: [id number] Date of Birth: Feb 17, 1961

## 2020-12-26 ENCOUNTER — Ambulatory Visit: Payer: Managed Care, Other (non HMO)

## 2021-01-17 ENCOUNTER — Other Ambulatory Visit: Payer: Self-pay | Admitting: Family Medicine

## 2021-01-17 DIAGNOSIS — Z1231 Encounter for screening mammogram for malignant neoplasm of breast: Secondary | ICD-10-CM

## 2021-02-26 ENCOUNTER — Other Ambulatory Visit: Payer: Self-pay | Admitting: Neurosurgery

## 2021-03-07 ENCOUNTER — Other Ambulatory Visit: Payer: Self-pay

## 2021-03-07 ENCOUNTER — Ambulatory Visit: Payer: Managed Care, Other (non HMO)

## 2021-03-07 ENCOUNTER — Ambulatory Visit
Admission: RE | Admit: 2021-03-07 | Discharge: 2021-03-07 | Disposition: A | Discharge status: Home / Self Care | Payer: Managed Care, Other (non HMO) | Source: Ambulatory Visit | Attending: Family Medicine | Admitting: Family Medicine

## 2021-03-07 DIAGNOSIS — Z1231 Encounter for screening mammogram for malignant neoplasm of breast: Secondary | ICD-10-CM

## 2021-06-06 NOTE — Pre-Procedure Instructions (Signed)
Surgical Instructions    Your procedure is scheduled on Wednesday, June 29th.  Report to [facility] Main Entrance "A" at 5:30 A.M., then check in with the Admitting office.  Call this number if you have problems the morning of surgery:  [phone]   If you have any questions prior to your surgery date call [phone]: Open Monday-Friday 8am-4pm    Remember:  Do not eat or drink after midnight the night before your surgery    Take these medicines the morning of surgery with A SIP OF WATER  acetaminophen (TYLENOL) as needed  As of today, STOP taking any Aspirin (unless otherwise instructed by your surgeon), celecoxib (CELEBREX), Aleve, Naproxen, Ibuprofen, Motrin, Advil, Goody's, BC's, all herbal medications, fish oil, and all vitamins.                     Do NOT Smoke (Tobacco/Vaping) or drink Alcohol 24 hours prior to your procedure.  If you use a CPAP at night, you may bring all equipment for your overnight stay.   Contacts, glasses, piercing's, hearing aid's, dentures or partials may not be worn into surgery, please bring cases for these belongings.    For patients admitted to the hospital, discharge time will be determined by your treatment team.   Patients discharged the day of surgery will not be allowed to drive home, and someone needs to stay with them for 24 hours.  ONLY 1 SUPPORT PERSON MAY BE PRESENT WHILE YOU ARE IN SURGERY. IF YOU ARE TO BE ADMITTED ONCE YOU ARE IN YOUR ROOM YOU WILL BE ALLOWED TWO (2) VISITORS.  Minor children may have two parents present. Special consideration for safety and communication needs will be reviewed on a case by case basis.   Special instructions:   [place]- Preparing For Surgery  Before surgery, you can play an important role. Because skin is not sterile, your skin needs to be as free of germs as possible. You can reduce the number of germs on your skin by washing with CHG (chlorahexidine gluconate) Soap before surgery.  CHG is  an antiseptic cleaner which kills germs and bonds with the skin to continue killing germs even after washing.    Oral Hygiene is also important to reduce your risk of infection.  Remember - BRUSH YOUR TEETH THE MORNING OF SURGERY WITH YOUR REGULAR TOOTHPASTE  Please do not use if you have an allergy to CHG or antibacterial soaps. If your skin becomes reddened/irritated stop using the CHG.  Do not shave (including legs and underarms) for at least 48 hours prior to first CHG shower. It is OK to shave your face.  Please follow these instructions carefully.   Shower the NIGHT BEFORE SURGERY and the MORNING OF SURGERY  If you chose to wash your hair, wash your hair first as usual with your normal shampoo.  After you shampoo, rinse your hair and body thoroughly to remove the shampoo.  Use CHG Soap as you would any other liquid soap. You can apply CHG directly to the skin and wash gently with a scrungie or a clean washcloth.   Apply the CHG Soap to your body ONLY FROM THE NECK DOWN.  Do not use on open wounds or open sores. Avoid contact with your eyes, ears, mouth and genitals (private parts). Wash Face and genitals (private parts)  with your normal soap.   Wash thoroughly, paying special attention to the area where your surgery will be performed.  Thoroughly rinse your  body with warm water from the neck down.  DO NOT shower/wash with your normal soap after using and rinsing off the CHG Soap.  Pat yourself dry with a CLEAN TOWEL.  Wear CLEAN PAJAMAS to bed the night before surgery  Place CLEAN SHEETS on your bed the night before your surgery  DO NOT SLEEP WITH PETS.   Day of Surgery: Shower with CHG soap. Do not wear jewelry, make up, nail polish, gel polish, artificial nails, or any other type of covering on natural nails including finger and toenails. If patients have artificial nails, gel coating, etc. that need to be removed by a nail salon please have this removed prior to  surgery. Surgery may need to be canceled/delayed if the surgeon/ anesthesia feels like the patient is unable to be adequately monitored. Do not wear lotions, powders, perfumes, or deodorant. Do not shave 48 hours prior to surgery.  Do not bring valuables to the hospital. [facility] is not responsible for any belongings or valuables. Wear Clean/Comfortable clothing the morning of surgery Remember to brush your teeth WITH YOUR REGULAR TOOTHPASTE.   Please read over the following fact sheets that you were given.

## 2021-06-09 ENCOUNTER — Encounter (HOSPITAL_COMMUNITY)
Admission: RE | Admit: 2021-06-09 | Discharge: 2021-06-09 | Disposition: A | Discharge status: Home / Self Care | Payer: Managed Care, Other (non HMO) | Source: Ambulatory Visit | Attending: Neurosurgery | Admitting: Neurosurgery

## 2021-06-09 ENCOUNTER — Encounter (HOSPITAL_COMMUNITY): Payer: Self-pay

## 2021-06-09 ENCOUNTER — Other Ambulatory Visit: Payer: Self-pay

## 2021-06-09 DIAGNOSIS — Z20822 Contact with and (suspected) exposure to covid-19: Secondary | ICD-10-CM | POA: Insufficient documentation

## 2021-06-09 DIAGNOSIS — Z01818 Encounter for other preprocedural examination: Secondary | ICD-10-CM | POA: Insufficient documentation

## 2021-06-09 DIAGNOSIS — I10 Essential (primary) hypertension: Secondary | ICD-10-CM | POA: Insufficient documentation

## 2021-06-09 DIAGNOSIS — M4316 Spondylolisthesis, lumbar region: Secondary | ICD-10-CM | POA: Insufficient documentation

## 2021-06-09 DIAGNOSIS — Z79899 Other long term (current) drug therapy: Secondary | ICD-10-CM | POA: Insufficient documentation

## 2021-06-09 LAB — CBC
HCT: 40 % (ref 36.0–46.0)
Hemoglobin: 13 g/dL (ref 12.0–15.0)
MCH: 28.2 pg (ref 26.0–34.0)
MCHC: 32.5 g/dL (ref 30.0–36.0)
MCV: 86.8 fL (ref 80.0–100.0)
Platelets: 402 10*3/uL — ABNORMAL HIGH (ref 150–400)
RBC: 4.61 MIL/uL (ref 3.87–5.11)
RDW: 12.7 % (ref 11.5–15.5)
WBC: 5.7 10*3/uL (ref 4.0–10.5)
nRBC: 0 % (ref 0.0–0.2)

## 2021-06-09 LAB — BASIC METABOLIC PANEL
Anion gap: 11 (ref 5–15)
BUN: 11 mg/dL (ref 6–20)
CO2: 27 mmol/L (ref 22–32)
Calcium: 9.7 mg/dL (ref 8.9–10.3)
Chloride: 97 mmol/L — ABNORMAL LOW (ref 98–111)
Creatinine, Ser: 0.72 mg/dL (ref 0.44–1.00)
GFR, Estimated: >60 mL/min (ref >60)
Glucose, Bld: 99 mg/dL (ref 70–99)
Potassium: 3.2 mmol/L — ABNORMAL LOW (ref 3.5–5.1)
Sodium: 135 mmol/L (ref 135–145)

## 2021-06-09 LAB — TYPE AND SCREEN
ABO/RH(D): A POS
Antibody Screen: NEGATIVE

## 2021-06-09 LAB — SURGICAL PCR SCREEN
MRSA, PCR: NEGATIVE
Staphylococcus aureus: NEGATIVE

## 2021-06-09 NOTE — Progress Notes (Signed)
PCP - Dr. Sharilyn Sites Cardiologist - denies  Chest x-ray - n/a EKG - 06/09/21 Stress Test - denies ECHO - denies Cardiac Cath - denies  Sleep Study - denies CPAP - denies  Blood Thinner Instructions: n/a Aspirin Instructions: n/a  COVID TEST- 06/09/21 done in PAT.  Anesthesia review: Yes, medical clearance obtained.  Patient denies shortness of breath, fever, cough and chest pain at PAT appointment   All instructions explained to the patient, with a verbal understanding of the material. Patient agrees to go over the instructions while at home for a better understanding. Patient also instructed to self quarantine after being tested for COVID-19. The opportunity to ask questions was provided.

## 2021-06-10 LAB — SARS CORONAVIRUS 2 (TAT 6-24 HRS): SARS Coronavirus 2: NEGATIVE

## 2021-06-10 NOTE — Progress Notes (Signed)
Anesthesia Chart Review:   Case: [id number] Date/Time: 06/11/21 0715   Procedure: PLIF,IP,PSI L45 - 3C   Anesthesia type: General   Pre-op diagnosis: SPONDYLOLISTHESIS, LUMBAR REGION   Location: [place] OR ROOM 93 / [place] [facility]   Surgeons: Newman Pies, MD       DISCUSSION: Pt is 60 years old with hx HTN, anemia  Pt did not report active CV symptoms at pre-admission testing  Reviewed EKG with Dr. Lissa Hoard.   VS: BP (!) 127/92   Pulse 87   Temp 36.8 C (Oral)   Resp 17   Ht 5' 2.5" (1.588 m)   Wt 65.3 kg   LMP 05/15/2015 Comment: bleeding  SpO2 100%   BMI 25.92 kg/m   PROVIDERS: - PCP is Sharilyn Sites, MD who cleared pt for surgery at low risk   LABS: Labs reviewed: Acceptable for surgery. (all labs ordered are listed, but only abnormal results are displayed)  Labs Reviewed  BASIC METABOLIC PANEL - Abnormal; Notable for the following components:      Result Value   Potassium 3.2 (*)    Chloride 97 (*)    All other components within normal limits  CBC - Abnormal; Notable for the following components:   Platelets 402 (*)    All other components within normal limits  SARS CORONAVIRUS 2 (TAT 6-24 HRS)  SURGICAL PCR SCREEN  TYPE AND SCREEN    EKG 06/09/21:  NSR Low voltage QRS Cannot rule out Inferior infarct , age undetermined - No prior tracing available  CV: N/A  Past Medical History:  Diagnosis Date   Allergic rhinitis    Anemia    Depression    history   Esophageal reflux    Hiatal hernia    Hypertension    Insomnia    Motility disorder, esophageal    Muscle weakness    Seasonal allergies    SVD (spontaneous vaginal delivery)    x 1    Past Surgical History:  Procedure Laterality Date   BILATERAL SALPINGECTOMY Bilateral 05/21/2015   Procedure: BILATERAL SALPINGECTOMY;  Surgeon: Vanessa Kick, MD;  Location: [facility];  Service: Gynecology;  Laterality: Bilateral;   COLONOSCOPY     LAPAROSCOPIC ASSISTED VAGINAL HYSTERECTOMY N/A 05/21/2015   Procedure:  LAPAROSCOPIC ASSISTED VAGINAL HYSTERECTOMY;  Surgeon: Vanessa Kick, MD;  Location: [place] [facility];  Service: Gynecology;  Laterality: N/A;   UPPER GI ENDOSCOPY     WISDOM TOOTH EXTRACTION      MEDICATIONS:  acetaminophen (TYLENOL) 500 MG tablet   celecoxib (CELEBREX) 200 MG capsule   ibuprofen (ADVIL,MOTRIN) 600 MG tablet   losartan-hydrochlorothiazide (HYZAAR) 100-25 MG tablet   zolpidem (AMBIEN) 10 MG tablet   No current facility-administered medications for this encounter.    If no changes, I anticipate pt can proceed with surgery as scheduled.   Willeen Cass, PhD, FNP-BC [facility] Short Stay Surgical [facility]/Anesthesiology Phone: [phone] 06/10/2021 12:49 PM

## 2021-06-10 NOTE — Anesthesia Preprocedure Evaluation (Addendum)
Anesthesia Evaluation  Patient identified by MRN, date of birth, ID band Patient awake    Reviewed: Allergy & Precautions, H&P , NPO status , Patient's Chart, lab work & pertinent test results  Airway Mallampati: I  TM Distance: >3 FB Neck ROM: Full    Dental no notable dental hx. (+) Teeth Intact, Dental Advisory Given   Pulmonary neg pulmonary ROS,    Pulmonary exam normal breath sounds clear to auscultation       Cardiovascular Exercise Tolerance: Good hypertension, negative cardio ROS Normal cardiovascular exam Rhythm:Regular Rate:Normal     Neuro/Psych PSYCHIATRIC DISORDERS Depression negative neurological ROS     GI/Hepatic Neg liver ROS, hiatal hernia, GERD  Medicated and Controlled,  Endo/Other  negative endocrine ROS  Renal/GU negative Renal ROS  negative genitourinary   Musculoskeletal  (+) Arthritis , Osteoarthritis,    Abdominal   Peds negative pediatric ROS (+)  Hematology  (+) Blood dyscrasia, anemia ,   Anesthesia Other Findings   Reproductive/Obstetrics negative OB ROS                          Anesthesia Physical Anesthesia Plan  ASA: 3  Anesthesia Plan: General   Post-op Pain Management:    Induction: Intravenous  PONV Risk Score and Plan: 3 and Ondansetron, Dexamethasone, Treatment may vary due to age or medical condition and Scopolamine patch - Pre-op  Airway Management Planned: Oral ETT  Additional Equipment: None  Intra-op Plan:   Post-operative Plan: Extubation in OR  Informed Consent: I have reviewed the patients History and Physical, chart, labs and discussed the procedure including the risks, benefits and alternatives for the proposed anesthesia with the patient or authorized representative who has indicated his/her understanding and acceptance.       Plan Discussed with: Anesthesiologist and CRNA  Anesthesia Plan Comments: (See APP note by Durel Salts, FNP )      Anesthesia Quick Evaluation

## 2021-06-11 ENCOUNTER — Inpatient Hospital Stay (HOSPITAL_COMMUNITY): Payer: Managed Care, Other (non HMO)

## 2021-06-11 ENCOUNTER — Encounter (HOSPITAL_COMMUNITY)
Admission: RE | Disposition: A | Discharge status: Home / Self Care | Payer: Self-pay | Source: Home / Self Care | Attending: Neurosurgery

## 2021-06-11 ENCOUNTER — Encounter (HOSPITAL_COMMUNITY): Payer: Self-pay | Admitting: Neurosurgery

## 2021-06-11 ENCOUNTER — Inpatient Hospital Stay (HOSPITAL_COMMUNITY): Payer: Managed Care, Other (non HMO) | Admitting: Emergency Medicine

## 2021-06-11 ENCOUNTER — Other Ambulatory Visit: Payer: Self-pay

## 2021-06-11 ENCOUNTER — Inpatient Hospital Stay (HOSPITAL_COMMUNITY)
Admission: RE | Admit: 2021-06-11 | Discharge: 2021-06-12 | DRG: 455 | Disposition: A | Discharge status: Home / Self Care | Payer: Managed Care, Other (non HMO) | Attending: Neurosurgery | Admitting: Neurosurgery

## 2021-06-11 DIAGNOSIS — Z888 Allergy status to other drugs, medicaments and biological substances status: Secondary | ICD-10-CM

## 2021-06-11 DIAGNOSIS — Z79899 Other long term (current) drug therapy: Secondary | ICD-10-CM

## 2021-06-11 DIAGNOSIS — I10 Essential (primary) hypertension: Secondary | ICD-10-CM | POA: Diagnosis present

## 2021-06-11 DIAGNOSIS — Z20822 Contact with and (suspected) exposure to covid-19: Secondary | ICD-10-CM | POA: Diagnosis present

## 2021-06-11 DIAGNOSIS — Z8249 Family history of ischemic heart disease and other diseases of the circulatory system: Secondary | ICD-10-CM | POA: Diagnosis not present

## 2021-06-11 DIAGNOSIS — Z803 Family history of malignant neoplasm of breast: Secondary | ICD-10-CM | POA: Diagnosis not present

## 2021-06-11 DIAGNOSIS — M48062 Spinal stenosis, lumbar region with neurogenic claudication: Principal | ICD-10-CM | POA: Diagnosis present

## 2021-06-11 DIAGNOSIS — M4726 Other spondylosis with radiculopathy, lumbar region: Secondary | ICD-10-CM | POA: Diagnosis present

## 2021-06-11 DIAGNOSIS — M549 Dorsalgia, unspecified: Secondary | ICD-10-CM | POA: Diagnosis present

## 2021-06-11 DIAGNOSIS — M5116 Intervertebral disc disorders with radiculopathy, lumbar region: Secondary | ICD-10-CM | POA: Diagnosis present

## 2021-06-11 DIAGNOSIS — M4316 Spondylolisthesis, lumbar region: Secondary | ICD-10-CM | POA: Diagnosis present

## 2021-06-11 DIAGNOSIS — K219 Gastro-esophageal reflux disease without esophagitis: Secondary | ICD-10-CM | POA: Diagnosis present

## 2021-06-11 DIAGNOSIS — Z808 Family history of malignant neoplasm of other organs or systems: Secondary | ICD-10-CM

## 2021-06-11 DIAGNOSIS — Z419 Encounter for procedure for purposes other than remedying health state, unspecified: Secondary | ICD-10-CM

## 2021-06-11 HISTORY — PX: LUMBAR DISC SURGERY: SHX700

## 2021-06-11 SURGERY — POSTERIOR LUMBAR FUSION 1 LEVEL
Anesthesia: General

## 2021-06-11 MED ORDER — MIDAZOLAM HCL 5 MG/5ML IJ SOLN
INTRAMUSCULAR | Status: DC | PRN
  Administered 2021-06-11: 2 mg via INTRAVENOUS

## 2021-06-11 MED ORDER — BACITRACIN ZINC 500 UNIT/GM EX OINT
TOPICAL_OINTMENT | CUTANEOUS | Status: AC
  Filled 2021-06-11: qty 28.35

## 2021-06-11 MED ORDER — OXYCODONE HCL 5 MG PO TABS
10.0000 mg | ORAL_TABLET | ORAL | Status: DC | PRN
  Administered 2021-06-11 – 2021-06-12 (×4): 10 mg via ORAL
  Filled 2021-06-11 (×4): qty 2

## 2021-06-11 MED ORDER — ONDANSETRON HCL 4 MG PO TABS
4.0000 mg | ORAL_TABLET | Freq: Four times a day (QID) | ORAL | Status: DC | PRN
  Administered 2021-06-12: 4 mg via ORAL
  Filled 2021-06-11: qty 1

## 2021-06-11 MED ORDER — FENTANYL CITRATE (PF) 250 MCG/5ML IJ SOLN
INTRAMUSCULAR | Status: DC | PRN
  Administered 2021-06-11: 25 ug via INTRAVENOUS
  Administered 2021-06-11: 50 ug via INTRAVENOUS
  Administered 2021-06-11: 25 ug via INTRAVENOUS
  Administered 2021-06-11: 100 ug via INTRAVENOUS

## 2021-06-11 MED ORDER — ACETAMINOPHEN 160 MG/5ML PO SOLN: 325.0000 mg | ORAL | Status: DC | PRN

## 2021-06-11 MED ORDER — BACITRACIN ZINC 500 UNIT/GM EX OINT
TOPICAL_OINTMENT | CUTANEOUS | Status: DC | PRN
  Administered 2021-06-11: 1 via TOPICAL

## 2021-06-11 MED ORDER — DEXAMETHASONE SODIUM PHOSPHATE 10 MG/ML IJ SOLN
INTRAMUSCULAR | Status: DC | PRN
  Administered 2021-06-11: 10 mg via INTRAVENOUS

## 2021-06-11 MED ORDER — CEFAZOLIN SODIUM-DEXTROSE 2-4 GM/100ML-% IV SOLN
2.0000 g | INTRAVENOUS | Status: AC
  Administered 2021-06-11: 2 g via INTRAVENOUS
  Filled 2021-06-11: qty 100

## 2021-06-11 MED ORDER — ONDANSETRON HCL 4 MG/2ML IJ SOLN
INTRAMUSCULAR | Status: DC | PRN
  Administered 2021-06-11: 4 mg via INTRAVENOUS

## 2021-06-11 MED ORDER — ONDANSETRON HCL 4 MG/2ML IJ SOLN
4.0000 mg | Freq: Once | INTRAMUSCULAR | Status: AC | PRN
  Administered 2021-06-11: 4 mg via INTRAVENOUS

## 2021-06-11 MED ORDER — SUGAMMADEX SODIUM 200 MG/2ML IV SOLN
INTRAVENOUS | Status: DC | PRN
  Administered 2021-06-11: 100 mg via INTRAVENOUS
  Administered 2021-06-11: 50 mg via INTRAVENOUS

## 2021-06-11 MED ORDER — LIDOCAINE 2% (20 MG/ML) 5 ML SYRINGE
INTRAMUSCULAR | Status: DC | PRN
  Administered 2021-06-11: 60 mg via INTRAVENOUS

## 2021-06-11 MED ORDER — ORAL CARE MOUTH RINSE: 15.0000 mL | Freq: Once | OROMUCOSAL | Status: AC

## 2021-06-11 MED ORDER — CYCLOBENZAPRINE HCL 10 MG PO TABS
10.0000 mg | ORAL_TABLET | Freq: Three times a day (TID) | ORAL | Status: DC | PRN
  Administered 2021-06-11 (×2): 10 mg via ORAL
  Filled 2021-06-11 (×2): qty 1

## 2021-06-11 MED ORDER — BUPIVACAINE LIPOSOME 1.3 % IJ SUSP
INTRAMUSCULAR | Status: DC | PRN
  Administered 2021-06-11: 20 mL

## 2021-06-11 MED ORDER — PROPOFOL 500 MG/50ML IV EMUL
INTRAVENOUS | Status: DC | PRN
  Administered 2021-06-11: 25 ug/kg/min via INTRAVENOUS

## 2021-06-11 MED ORDER — ACETAMINOPHEN 325 MG PO TABS
650.0000 mg | ORAL_TABLET | ORAL | Status: DC | PRN
  Administered 2021-06-12: 650 mg via ORAL

## 2021-06-11 MED ORDER — ONDANSETRON HCL 4 MG/2ML IJ SOLN
INTRAMUSCULAR | Status: AC
  Filled 2021-06-11: qty 2

## 2021-06-11 MED ORDER — CHLORHEXIDINE GLUCONATE CLOTH 2 % EX PADS: 6.0000 | MEDICATED_PAD | Freq: Once | CUTANEOUS | Status: DC

## 2021-06-11 MED ORDER — 0.9 % SODIUM CHLORIDE (POUR BTL) OPTIME
TOPICAL | Status: DC | PRN
  Administered 2021-06-11: 1000 mL

## 2021-06-11 MED ORDER — ONDANSETRON HCL 4 MG/2ML IJ SOLN: 4.0000 mg | Freq: Four times a day (QID) | INTRAMUSCULAR | Status: DC | PRN

## 2021-06-11 MED ORDER — PROPOFOL 10 MG/ML IV BOLUS
INTRAVENOUS | Status: DC | PRN
  Administered 2021-06-11: 100 mg via INTRAVENOUS

## 2021-06-11 MED ORDER — DIPHENHYDRAMINE HCL 50 MG/ML IJ SOLN
INTRAMUSCULAR | Status: DC | PRN
  Administered 2021-06-11: 6.25 mg via INTRAVENOUS

## 2021-06-11 MED ORDER — BUPIVACAINE-EPINEPHRINE 0.5% -1:200000 IJ SOLN
INTRAMUSCULAR | Status: AC
  Filled 2021-06-11: qty 1

## 2021-06-11 MED ORDER — ACETAMINOPHEN 10 MG/ML IV SOLN
INTRAVENOUS | Status: DC | PRN
  Administered 2021-06-11: 1000 mg via INTRAVENOUS

## 2021-06-11 MED ORDER — THROMBIN 5000 UNITS EX SOLR
OROMUCOSAL | Status: DC | PRN
  Administered 2021-06-11: 5 mL via TOPICAL

## 2021-06-11 MED ORDER — CHLORHEXIDINE GLUCONATE 0.12 % MT SOLN
15.0000 mL | Freq: Once | OROMUCOSAL | Status: AC
  Administered 2021-06-11: 15 mL via OROMUCOSAL
  Filled 2021-06-11: qty 15

## 2021-06-11 MED ORDER — LOSARTAN POTASSIUM-HCTZ 100-25 MG PO TABS: 1.0000 | ORAL_TABLET | Freq: Every day | ORAL | Status: DC

## 2021-06-11 MED ORDER — HYDROCHLOROTHIAZIDE 25 MG PO TABS
25.0000 mg | ORAL_TABLET | Freq: Every day | ORAL | Status: DC
  Administered 2021-06-11: 25 mg via ORAL
  Filled 2021-06-11: qty 1

## 2021-06-11 MED ORDER — BISACODYL 10 MG RE SUPP: 10.0000 mg | Freq: Every day | RECTAL | Status: DC | PRN

## 2021-06-11 MED ORDER — SODIUM CHLORIDE 0.9% FLUSH: 3.0000 mL | INTRAVENOUS | Status: DC | PRN

## 2021-06-11 MED ORDER — PHENYLEPHRINE HCL-NACL 10-0.9 MG/250ML-% IV SOLN
INTRAVENOUS | Status: AC
  Filled 2021-06-11: qty 500

## 2021-06-11 MED ORDER — DEXAMETHASONE SODIUM PHOSPHATE 10 MG/ML IJ SOLN
INTRAMUSCULAR | Status: AC
  Filled 2021-06-11: qty 1

## 2021-06-11 MED ORDER — MIDAZOLAM HCL 2 MG/2ML IJ SOLN
INTRAMUSCULAR | Status: AC
  Filled 2021-06-11: qty 2

## 2021-06-11 MED ORDER — FENTANYL CITRATE (PF) 100 MCG/2ML IJ SOLN
INTRAMUSCULAR | Status: AC
  Filled 2021-06-11: qty 2

## 2021-06-11 MED ORDER — PROPOFOL 10 MG/ML IV BOLUS
INTRAVENOUS | Status: AC
  Filled 2021-06-11: qty 20

## 2021-06-11 MED ORDER — LIDOCAINE 2% (20 MG/ML) 5 ML SYRINGE
INTRAMUSCULAR | Status: AC
  Filled 2021-06-11: qty 5

## 2021-06-11 MED ORDER — ZOLPIDEM TARTRATE 5 MG PO TABS: 5.0000 mg | ORAL_TABLET | Freq: Every evening | ORAL | Status: DC | PRN

## 2021-06-11 MED ORDER — OXYCODONE HCL 5 MG PO TABS: 5.0000 mg | ORAL_TABLET | Freq: Once | ORAL | Status: DC | PRN

## 2021-06-11 MED ORDER — FENTANYL CITRATE (PF) 250 MCG/5ML IJ SOLN
INTRAMUSCULAR | Status: AC
  Filled 2021-06-11: qty 5

## 2021-06-11 MED ORDER — MENTHOL 3 MG MT LOZG: 1.0000 | LOZENGE | OROMUCOSAL | Status: DC | PRN

## 2021-06-11 MED ORDER — THROMBIN 5000 UNITS EX SOLR
CUTANEOUS | Status: AC
  Filled 2021-06-11: qty 5000

## 2021-06-11 MED ORDER — ACETAMINOPHEN 650 MG RE SUPP: 650.0000 mg | RECTAL | Status: DC | PRN

## 2021-06-11 MED ORDER — PHENOL 1.4 % MT LIQD: 1.0000 | OROMUCOSAL | Status: DC | PRN

## 2021-06-11 MED ORDER — SODIUM CHLORIDE 0.9 % IV SOLN: 250.0000 mL | INTRAVENOUS | Status: DC

## 2021-06-11 MED ORDER — ROCURONIUM BROMIDE 10 MG/ML (PF) SYRINGE
PREFILLED_SYRINGE | INTRAVENOUS | Status: DC | PRN
  Administered 2021-06-11 (×2): 10 mg via INTRAVENOUS
  Administered 2021-06-11: 50 mg via INTRAVENOUS

## 2021-06-11 MED ORDER — LACTATED RINGERS IV SOLN: INTRAVENOUS | Status: DC

## 2021-06-11 MED ORDER — SODIUM CHLORIDE 0.9% FLUSH: 3.0000 mL | Freq: Two times a day (BID) | INTRAVENOUS | Status: DC

## 2021-06-11 MED ORDER — SCOPOLAMINE 1 MG/3DAYS TD PT72
MEDICATED_PATCH | TRANSDERMAL | Status: DC | PRN
  Administered 2021-06-11: 1 via TRANSDERMAL

## 2021-06-11 MED ORDER — BUPIVACAINE LIPOSOME 1.3 % IJ SUSP
INTRAMUSCULAR | Status: AC
  Filled 2021-06-11: qty 20

## 2021-06-11 MED ORDER — CEFAZOLIN SODIUM-DEXTROSE 2-4 GM/100ML-% IV SOLN
2.0000 g | Freq: Three times a day (TID) | INTRAVENOUS | Status: AC
  Administered 2021-06-11 (×2): 2 g via INTRAVENOUS
  Filled 2021-06-11 (×2): qty 100

## 2021-06-11 MED ORDER — ACETAMINOPHEN 500 MG PO TABS
1000.0000 mg | ORAL_TABLET | Freq: Four times a day (QID) | ORAL | Status: AC
  Administered 2021-06-11 (×3): 1000 mg via ORAL
  Filled 2021-06-11 (×4): qty 2

## 2021-06-11 MED ORDER — PHENYLEPHRINE HCL-NACL 10-0.9 MG/250ML-% IV SOLN
INTRAVENOUS | Status: DC | PRN
  Administered 2021-06-11: 20 ug/min via INTRAVENOUS

## 2021-06-11 MED ORDER — ROCURONIUM BROMIDE 10 MG/ML (PF) SYRINGE
PREFILLED_SYRINGE | INTRAVENOUS | Status: AC
  Filled 2021-06-11: qty 10

## 2021-06-11 MED ORDER — LOSARTAN POTASSIUM 50 MG PO TABS
100.0000 mg | ORAL_TABLET | Freq: Every day | ORAL | Status: DC
  Administered 2021-06-11: 100 mg via ORAL
  Filled 2021-06-11: qty 2

## 2021-06-11 MED ORDER — DOCUSATE SODIUM 100 MG PO CAPS
100.0000 mg | ORAL_CAPSULE | Freq: Two times a day (BID) | ORAL | Status: DC
  Administered 2021-06-11: 100 mg via ORAL
  Filled 2021-06-11: qty 1

## 2021-06-11 MED ORDER — MEPERIDINE HCL 25 MG/ML IJ SOLN
6.2500 mg | INTRAMUSCULAR | Status: DC | PRN
Start: 2021-06-11 — End: 2021-06-11

## 2021-06-11 MED ORDER — ACETAMINOPHEN 10 MG/ML IV SOLN
INTRAVENOUS | Status: AC
  Filled 2021-06-11: qty 100

## 2021-06-11 MED ORDER — MORPHINE SULFATE (PF) 4 MG/ML IV SOLN
4.0000 mg | INTRAVENOUS | Status: DC | PRN
Start: 2021-06-11 — End: 2021-06-12
  Administered 2021-06-11: 4 mg via INTRAVENOUS
  Filled 2021-06-11: qty 1

## 2021-06-11 MED ORDER — OXYCODONE HCL 5 MG PO TABS: 5.0000 mg | ORAL_TABLET | ORAL | Status: DC | PRN

## 2021-06-11 MED ORDER — FENTANYL CITRATE (PF) 100 MCG/2ML IJ SOLN
25.0000 ug | INTRAMUSCULAR | Status: DC | PRN
  Administered 2021-06-11 (×3): 50 ug via INTRAVENOUS

## 2021-06-11 MED ORDER — OXYCODONE HCL 5 MG/5ML PO SOLN: 5.0000 mg | Freq: Once | ORAL | Status: DC | PRN

## 2021-06-11 MED ORDER — ACETAMINOPHEN 325 MG PO TABS: 325.0000 mg | ORAL_TABLET | ORAL | Status: DC | PRN

## 2021-06-11 MED ORDER — BUPIVACAINE-EPINEPHRINE (PF) 0.5% -1:200000 IJ SOLN
INTRAMUSCULAR | Status: DC | PRN
  Administered 2021-06-11: 10 mL via PERINEURAL

## 2021-06-11 MED ORDER — DIPHENHYDRAMINE HCL 50 MG/ML IJ SOLN
INTRAMUSCULAR | Status: AC
  Filled 2021-06-11: qty 1

## 2021-06-11 SURGICAL SUPPLY — 67 items
APL SKNCLS STERI-STRIP NONHPOA (GAUZE/BANDAGES/DRESSINGS) ×1
BAG COUNTER SPONGE SURGICOUNT (BAG) ×3 IMPLANT
BAG SPNG CNTER NS LX DISP (BAG) ×2
BASKET BONE COLLECTION (BASKET) ×1 IMPLANT
BENZOIN TINCTURE PRP APPL 2/3 (GAUZE/BANDAGES/DRESSINGS) ×2 IMPLANT
BLADE CLIPPER SURG (BLADE) IMPLANT
BUR MATCHSTICK NEURO 3.0 LAGG (BURR) ×2 IMPLANT
BUR PRECISION FLUTE 6.0 (BURR) ×2 IMPLANT
CANISTER SUCT 3000ML PPV (MISCELLANEOUS) ×2 IMPLANT
CAP LOCK DLX THRD (Cap) ×4 IMPLANT
CARTRIDGE OIL MAESTRO DRILL (MISCELLANEOUS) ×1 IMPLANT
CNTNR URN SCR LID CUP LEK RST (MISCELLANEOUS) ×1 IMPLANT
CONT SPEC 4OZ STRL OR WHT (MISCELLANEOUS) ×2
COVER BACK TABLE 60X90IN (DRAPES) ×2 IMPLANT
DECANTER SPIKE VIAL GLASS SM (MISCELLANEOUS) ×2 IMPLANT
DIFFUSER DRILL AIR PNEUMATIC (MISCELLANEOUS) ×2 IMPLANT
DRAPE C-ARM 42X72 X-RAY (DRAPES) ×4 IMPLANT
DRAPE HALF SHEET 40X57 (DRAPES) ×2 IMPLANT
DRAPE LAPAROTOMY 100X72X124 (DRAPES) ×2 IMPLANT
DRAPE SURG 17X23 STRL (DRAPES) ×8 IMPLANT
DRSG OPSITE POSTOP 4X6 (GAUZE/BANDAGES/DRESSINGS) ×2 IMPLANT
ELECT BLADE 4.0 EZ CLEAN MEGAD (MISCELLANEOUS) ×2
ELECT REM PT RETURN 9FT ADLT (ELECTROSURGICAL) ×2
ELECTRODE BLDE 4.0 EZ CLN MEGD (MISCELLANEOUS) ×1 IMPLANT
ELECTRODE REM PT RTRN 9FT ADLT (ELECTROSURGICAL) ×1 IMPLANT
EVACUATOR 1/8 PVC DRAIN (DRAIN) IMPLANT
GAUZE 4X4 16PLY ~~LOC~~+RFID DBL (SPONGE) ×2 IMPLANT
GLOVE EXAM NITRILE XL STR (GLOVE) IMPLANT
GLOVE SURG ENC MOIS LTX SZ8 (GLOVE) ×4 IMPLANT
GLOVE SURG ENC MOIS LTX SZ8.5 (GLOVE) ×4 IMPLANT
GLOVE SURG UNDER POLY LF SZ7.5 (GLOVE) ×3 IMPLANT
GOWN STRL REUS W/ TWL LRG LVL3 (GOWN DISPOSABLE) IMPLANT
GOWN STRL REUS W/ TWL XL LVL3 (GOWN DISPOSABLE) ×2 IMPLANT
GOWN STRL REUS W/TWL 2XL LVL3 (GOWN DISPOSABLE) IMPLANT
GOWN STRL REUS W/TWL LRG LVL3 (GOWN DISPOSABLE) ×2
GOWN STRL REUS W/TWL XL LVL3 (GOWN DISPOSABLE) ×6
HEMOSTAT POWDER KIT SURGIFOAM (HEMOSTASIS) ×2 IMPLANT
KIT BASIN OR (CUSTOM PROCEDURE TRAY) ×2 IMPLANT
KIT TURNOVER KIT B (KITS) ×2 IMPLANT
MILL MEDIUM DISP (BLADE) ×1 IMPLANT
NDL HYPO 21X1.5 SAFETY (NEEDLE) IMPLANT
NEEDLE HYPO 21X1.5 SAFETY (NEEDLE) ×2 IMPLANT
NEEDLE HYPO 22GX1.5 SAFETY (NEEDLE) ×2 IMPLANT
NS IRRIG 1000ML POUR BTL (IV SOLUTION) ×2 IMPLANT
OIL CARTRIDGE MAESTRO DRILL (MISCELLANEOUS) ×2
PACK LAMINECTOMY NEURO (CUSTOM PROCEDURE TRAY) ×2 IMPLANT
PAD ARMBOARD 7.5X6 YLW CONV (MISCELLANEOUS) ×6 IMPLANT
PATTIES SURGICAL .5 X1 (DISPOSABLE) IMPLANT
PUTTY DBM 10CC CALC GRAN (Putty) ×1 IMPLANT
ROD CREO DLX CVD 6.35X40 (Rod) IMPLANT
ROD CURVED TI 6.35X40 (Rod) ×4 IMPLANT
SCREW CREO DLX POLY 6.5X40 (Screw) ×2 IMPLANT
SCREW PA CREO DLX 6.5X50 (Screw) ×1 IMPLANT
SCREW PA CREO DLX 6.5X55 (Screw) ×1 IMPLANT
SPACER ALTERA 10X26 10-14MM 15 (Spacer) ×1 IMPLANT
SPONGE NEURO XRAY DETECT 1X3 (DISPOSABLE) IMPLANT
SPONGE SURGIFOAM ABS GEL 100 (HEMOSTASIS) IMPLANT
SPONGE T-LAP 4X18 ~~LOC~~+RFID (SPONGE) ×1 IMPLANT
STRIP CLOSURE SKIN 1/2X4 (GAUZE/BANDAGES/DRESSINGS) ×2 IMPLANT
SUT VIC AB 1 CT1 18XBRD ANBCTR (SUTURE) ×2 IMPLANT
SUT VIC AB 1 CT1 8-18 (SUTURE) ×4
SUT VIC AB 2-0 CP2 18 (SUTURE) ×5 IMPLANT
SYR 20ML LL LF (SYRINGE) ×1 IMPLANT
TOWEL GREEN STERILE (TOWEL DISPOSABLE) ×2 IMPLANT
TOWEL GREEN STERILE FF (TOWEL DISPOSABLE) ×2 IMPLANT
TRAY FOLEY MTR SLVR 16FR STAT (SET/KITS/TRAYS/PACK) ×2 IMPLANT
WATER STERILE IRR 1000ML POUR (IV SOLUTION) ×2 IMPLANT

## 2021-06-11 NOTE — Addendum Note (Signed)
Addendum  created 06/11/21 1448 by Clearnce Sorrel, CRNA   Intraprocedure Event edited, Intraprocedure Meds edited

## 2021-06-11 NOTE — Anesthesia Procedure Notes (Signed)
Procedure Name: Intubation Date/Time: 06/11/2021 7:39 AM Performed by: Clearnce Sorrel, CRNA Pre-anesthesia Checklist: Patient identified, Emergency Drugs available, Suction available and Patient being monitored Patient Re-evaluated:Patient Re-evaluated prior to induction Oxygen Delivery Method: Circle System Utilized Preoxygenation: Pre-oxygenation with 100% oxygen Induction Type: IV induction Ventilation: Mask ventilation without difficulty Laryngoscope Size: Mac and 3 Grade View: Grade I Tube type: Oral Tube size: 7.0 mm Number of attempts: 1 Airway Equipment and Method: Stylet Placement Confirmation: ETT inserted through vocal cords under direct vision, positive ETCO2 and breath sounds checked- equal and bilateral Secured at: 22 cm Tube secured with: Tape Dental Injury: Teeth and Oropharynx as per pre-operative assessment  Comments: Performed by Heide Scales, SRNA under direct supervision by Dr. Ambrose Pancoast and Clearnce Sorrel, CRNA

## 2021-06-11 NOTE — Progress Notes (Signed)
Orthopedic Tech Progress Note Patient Details:  Carla Massey 24-Apr-1961 [id number] RN said patient has BRACE   Patient ID: Carla Massey, female   DOB: 1961-06-22, 60 y.o.   MRN: [id number]  Carla Massey 06/11/2021, 12:48 PM

## 2021-06-11 NOTE — Anesthesia Postprocedure Evaluation (Signed)
Anesthesia Post Note  Patient: Carla Massey  Procedure(s) Performed: Posterior Lumbar Interbody Fusion , Posterior Segmented Instrumentation Lumbar four-five     Patient location during evaluation: PACU Anesthesia Type: General Level of consciousness: awake and alert Pain management: pain level controlled Vital Signs Assessment: post-procedure vital signs reviewed and stable Respiratory status: spontaneous breathing, nonlabored ventilation, respiratory function stable and patient connected to nasal cannula oxygen Cardiovascular status: blood pressure returned to baseline and stable Postop Assessment: no apparent nausea or vomiting Anesthetic complications: no   No notable events documented.  Last Vitals:  Vitals:   06/11/21 1106 06/11/21 1150  BP: 104/68 104/67  Pulse: 78 61  Resp: 17 18  Temp: 36.7 C 36.4 C  SpO2: 93% 100%    Last Pain:  Vitals:   06/11/21 1150  TempSrc: Oral  PainSc:                  Karol Skarzynski

## 2021-06-11 NOTE — Transfer of Care (Signed)
Immediate Anesthesia Transfer of Care Note  Patient: Carla Massey  Procedure(s) Performed: Posterior Lumbar Interbody Fusion , Posterior Segmented Instrumentation Lumbar four-five  Patient Location: PACU  Anesthesia Type:General  Level of Consciousness: awake and alert   Airway & Oxygen Therapy: Patient Spontanous Breathing  Post-op Assessment: Report given to RN, Post -op Vital signs reviewed and stable and Patient moving all extremities X 4  Post vital signs: Reviewed and stable  Last Vitals:  Vitals Value Taken Time  BP 104/59 06/11/21 1036  Temp    Pulse 25 06/11/21 1038  Resp 12 06/11/21 1039  SpO2 78 % 06/11/21 1038  Vitals shown include unvalidated device data.  Last Pain:  Vitals:   06/11/21 0624  TempSrc:   PainSc: 0-No pain         Complications: No notable events documented.

## 2021-06-11 NOTE — H&P (Signed)
Subjective: The patient is a 60 year old white female who is complained of back and left greater right leg pain consistent with neurogenic claudication.  She has failed medical management worked up with lumbar x-rays and lumbar MRI which demonstrated L4-5 spondylolisthesis and spinal stenosis.  I discussed the various treatment options with her.  She has decided proceed with surgery.  Past Medical History:  Diagnosis Date   Allergic rhinitis    Anemia    Depression    history   Esophageal reflux    Hiatal hernia    Hypertension    Insomnia    Motility disorder, esophageal    Muscle weakness    Seasonal allergies    SVD (spontaneous vaginal delivery)    x 1    Past Surgical History:  Procedure Laterality Date   BILATERAL SALPINGECTOMY Bilateral 05/21/2015   Procedure: BILATERAL SALPINGECTOMY;  Surgeon: Vanessa Kick, MD;  Location: [place] [facility];  Service: Gynecology;  Laterality: Bilateral;   COLONOSCOPY     LAPAROSCOPIC ASSISTED VAGINAL HYSTERECTOMY N/A 05/21/2015   Procedure: LAPAROSCOPIC ASSISTED VAGINAL HYSTERECTOMY;  Surgeon: Vanessa Kick, MD;  Location: [facility] [facility];  Service: Gynecology;  Laterality: N/A;   UPPER GI ENDOSCOPY     WISDOM TOOTH EXTRACTION      Allergies  Allergen Reactions   Ketoconazole Hives    Nizoral for bacterial infection     Social History   Tobacco Use   Smoking status: Never   Smokeless tobacco: Never  Substance Use Topics   Alcohol use: No    Alcohol/week: 0.0 standard drinks    Family History  Problem Relation Age of Onset   Thyroid cancer Mother    Hypertension Mother    Breast cancer Mother 28   Hypertension Father    BRCA 1/2 Neg Hx    Prior to Admission medications   Medication Sig Start Date End Date Taking? Authorizing Provider  acetaminophen (TYLENOL) 500 MG tablet Take 1,000 mg by mouth every 6 (six) hours as needed for moderate pain, mild pain or headache.   Yes [provider]  celecoxib (CELEBREX) 200 MG capsule Take 200 mg by  mouth daily.   Yes [provider]  ibuprofen (ADVIL,MOTRIN) 600 MG tablet Take 1 tablet (600 mg total) by mouth every 6 (six) hours as needed (mild pain). Patient taking differently: Take 400 mg by mouth every 6 (six) hours as needed (mild pain). 05/22/15  Yes Vanessa Kick, MD  losartan-hydrochlorothiazide (HYZAAR) 100-25 MG tablet Take 1 tablet by mouth daily. 01/09/21  Yes [provider]  zolpidem (AMBIEN) 10 MG tablet Take 5 mg by mouth at bedtime as needed for sleep.   Yes [provider]     Review of Systems  Positive ROS: As above  All other systems have been reviewed and were otherwise negative with the exception of those mentioned in the HPI and as above.  Objective: Vital signs in last 24 hours: Temp:  [98.2 F (36.8 C)] 98.2 F (36.8 C) (06/29 0600) Pulse Rate:  [78] 78 (06/29 0600) Resp:  [18] 18 (06/29 0600) BP: (115)/(73) 115/73 (06/29 0600) SpO2:  [97 %] 97 % (06/29 0600) Weight:  [65.3 kg] 65.3 kg (06/29 0600) Estimated body mass index is 25.91 kg/m as calculated from the following:   Height as of this encounter: 5' 2.5" (1.588 m).   Weight as of this encounter: 65.3 kg.   General Appearance: Alert Head: Normocephalic, without obvious abnormality, atraumatic Eyes: PERRL, conjunctiva/corneas clear, EOM's intact,    Ears:  Normal  Throat: Normal  Neck: Supple, Back: unremarkable Lungs: Clear to auscultation bilaterally, respirations unlabored Heart: Regular rate and rhythm, no murmur, rub or gallop Abdomen: Soft, non-tender Extremities: Extremities normal, atraumatic, no cyanosis or edema Skin: unremarkable  NEUROLOGIC:   Mental status: alert and oriented,Motor Exam - grossly normal Sensory Exam - grossly normal Reflexes:  Coordination - grossly normal Gait - grossly normal Balance - grossly normal Cranial Nerves: I: smell Not tested  II: visual acuity  OS: Normal  OD: Normal   II: visual fields Full to confrontation  II:  pupils Equal, round, reactive to light  III,VII: ptosis None  III,IV,VI: extraocular muscles  Full ROM  V: mastication Normal  V: facial light touch sensation  Normal  V,VII: corneal reflex  Present  VII: facial muscle function - upper  Normal  VII: facial muscle function - lower Normal  VIII: hearing Not tested  IX: soft palate elevation  Normal  IX,X: gag reflex Present  XI: trapezius strength  5/5  XI: sternocleidomastoid strength 5/5  XI: neck flexion strength  5/5  XII: tongue strength  Normal    Data Review Lab Results  Component Value Date   WBC 5.7 06/09/2021   HGB 13.0 06/09/2021   HCT 40.0 06/09/2021   MCV 86.8 06/09/2021   PLT 402 (H) 06/09/2021   Lab Results  Component Value Date   NA 135 06/09/2021   K 3.2 (L) 06/09/2021   CL 97 (L) 06/09/2021   CO2 27 06/09/2021   BUN 11 06/09/2021   CREATININE 0.72 06/09/2021   GLUCOSE 99 06/09/2021   No results found for: INR, PROTIME  Assessment/Plan: L4-5 spondylolisthesis, facet arthropathy, spinal stenosis, lumbago, lumbar radiculopathy, neurogenic claudication: I have discussed the situation with the patient.  I reviewed her imaging studies with her and pointed out the abnormalities.  We have discussed the various treatment options including surgery.  I have described the surgical treatment option of an L4-5 decompression, instrumentation and fusion.  I have shown her surgical models.  I have given her a surgical pamphlet.  We have discussed the risk, benefits, alternatives, expected postoperative course, and likelihood of achieving our goals with surgery.  I have answered all her questions.  She has decided proceed with surgery.   Ophelia Charter 06/11/2021 7:25 AM

## 2021-06-11 NOTE — Op Note (Signed)
Brief history: The patient is a 60 year old white female who has complained of back and left great and right leg pain consistent with neurogenic claudication.  She has failed medical management and was worked up with lumbar x-rays and lumbar MRI which demonstrated an L4-5 spondylolisthesis and spinal stenosis.  I discussed the various treatment options with the patient.  She has decided proceed with surgery.  Preoperative diagnosis: L4-5 spondylolisthesis, facet arthropathy, degenerative disc disease, spinal stenosis compressing both the L4 and the L5 nerve roots; lumbago; lumbar radiculopathy; neurogenic claudication  Postoperative diagnosis: The same   Procedure: Bilateral L4-5 laminotomy/foraminotomies/medial facetectomy to decompress the bilateral L4 and L5 nerve roots(the work required to do this was in addition to the work required to do the posterior lumbar interbody fusion because of the patient's spinal stenosis, facet arthropathy. Etc. requiring a wide decompression of the nerve roots.);  Left L4-5 transforaminal lumbar interbody fusion with local morselized autograft bone and Zimmer DBM; insertion of interbody prosthesis at L4-5 (globus peek expandable interbody prosthesis); posterior nonsegmental instrumentation from L4 to L5 with globus titanium pedicle screws and rods; posterior lateral arthrodesis at L4-5 with local morselized autograft bone and Zimmer DBM.  Surgeon: Dr. Earle Gell  Asst.: Dr. Pieter Partridge Dawley and Arnetha Massy, NP  Anesthesia: Gen. endotracheal  Estimated blood loss: 200 cc  Drains: None  Complications: None  Description of procedure: The patient was brought to the operating room by the anesthesia team. General endotracheal anesthesia was induced. The patient was turned to the prone position on the Wilson frame. The patient's lumbosacral region was then prepared with Betadine scrub and Betadine solution. Sterile drapes were applied.  I then injected the area to be  incised with Marcaine with epinephrine solution. I then used the scalpel to make a linear midline incision over the L4-5 interspace. I then used electrocautery to perform a bilateral subperiosteal dissection exposing the spinous process and lamina of L4 and L5. We then obtained intraoperative radiograph to confirm our location. We then inserted the Verstrac retractor to provide exposure.  I began the decompression by using the high speed drill to perform laminotomies at L4-5 bilaterally. We then used the Kerrison punches to widen the laminotomy and removed the ligamentum flavum at L4-5 bilaterally. We used the Kerrison punches to remove the medial facets at L4-5 bilaterally, we removed the left L4-5 facet. We performed wide foraminotomies about the bilateral L4 and L5 nerve roots completing the decompression.  We now turned our attention to the posterior lumbar interbody fusion. I used a scalpel to incise the intervertebral disc at L4-5 bilaterally. I then performed a partial intervertebral discectomy at L4-5 bilaterally using the pituitary forceps. We prepared the vertebral endplates at V3-7 bilaterally for the fusion by removing the soft tissues with the curettes. We then used the trial spacers to pick the appropriate sized interbody prosthesis. We prefilled his prosthesis with a combination of local morselized autograft bone that we obtained during the decompression as well as Zimmer DBM. We inserted the prefilled prosthesis into the interspace at L4-5 from the left, we then turned and expanded the prosthesis. There was a good snug fit of the prosthesis in the interspace. We then filled and the remainder of the intervertebral disc space with local morselized autograft bone and Zimmer DBM. This completed the posterior lumbar interbody arthrodesis.  During the decompression and insertion of the prosthesis the assistant protected the thecal sac and nerve roots with the D'Errico retractor.  We now turned  attention to the  instrumentation. Under fluoroscopic guidance we cannulated the bilateral L4 and L5 pedicles with the bone probe. We then removed the bone probe. We then tapped the pedicle with a 5.5 millimeter tap. We then removed the tap. We probed inside the tapped pedicle with a ball probe to rule out cortical breaches. We then inserted a 6.5 x 45, 50 and 55 millimeter pedicle screw into the L4 and L5 pedicles bilaterally under fluoroscopic guidance. We then palpated along the medial aspect of the pedicles to rule out cortical breaches. There were none. The nerve roots were not injured. We then connected the unilateral pedicle screws with a lordotic rod. We compressed the construct and secured the rod in place with the caps. We then tightened the caps appropriately. This completed the instrumentation from L4-5 bilaterally.  We now turned our attention to the posterior lateral arthrodesis at L4-5. We used the high-speed drill to decorticate the remainder of the facets, pars, transverse process at L4-5. We then applied a combination of local morselized autograft bone and Zimmer DBM over these decorticated posterior lateral structures. This completed the posterior lateral arthrodesis.  We then obtained hemostasis using bipolar electrocautery. We irrigated the wound out with bacitracin solution. We inspected the thecal sac and nerve roots and noted they were well decompressed. We then removed the retractor.  We injected Exparel . We reapproximated patient's thoracolumbar fascia with interrupted #1 Vicryl suture. We reapproximated patient's subcutaneous tissue with interrupted 2-0 Vicryl suture. The reapproximated patient's skin with Steri-Strips and benzoin. The wound was then coated with bacitracin ointment. A sterile dressing was applied. The drapes were removed. The patient was subsequently returned to the supine position where they were extubated by the anesthesia team. He was then transported to the post  anesthesia care unit in stable condition. All sponge instrument and needle counts were reportedly correct at the end of this case.

## 2021-06-12 LAB — BASIC METABOLIC PANEL
Anion gap: 9 (ref 5–15)
BUN: 7 mg/dL (ref 6–20)
CO2: 26 mmol/L (ref 22–32)
Calcium: 8.8 mg/dL — ABNORMAL LOW (ref 8.9–10.3)
Chloride: 97 mmol/L — ABNORMAL LOW (ref 98–111)
Creatinine, Ser: 0.65 mg/dL (ref 0.44–1.00)
GFR, Estimated: >60 mL/min (ref >60)
Glucose, Bld: 120 mg/dL — ABNORMAL HIGH (ref 70–99)
Potassium: 3.4 mmol/L — ABNORMAL LOW (ref 3.5–5.1)
Sodium: 132 mmol/L — ABNORMAL LOW (ref 135–145)

## 2021-06-12 LAB — CBC
HCT: 31.9 % — ABNORMAL LOW (ref 36.0–46.0)
Hemoglobin: 10.7 g/dL — ABNORMAL LOW (ref 12.0–15.0)
MCH: 28.7 pg (ref 26.0–34.0)
MCHC: 33.5 g/dL (ref 30.0–36.0)
MCV: 85.5 fL (ref 80.0–100.0)
Platelets: 317 10*3/uL (ref 150–400)
RBC: 3.73 MIL/uL — ABNORMAL LOW (ref 3.87–5.11)
RDW: 12.5 % (ref 11.5–15.5)
WBC: 14.6 10*3/uL — ABNORMAL HIGH (ref 4.0–10.5)
nRBC: 0 % (ref 0.0–0.2)

## 2021-06-12 MED ORDER — OXYCODONE-ACETAMINOPHEN 5-325 MG PO TABS: 1.0000 | ORAL_TABLET | ORAL | Status: DC | PRN

## 2021-06-12 MED ORDER — OXYCODONE-ACETAMINOPHEN 5-325 MG PO TABS: 1.0000 | ORAL_TABLET | ORAL | 0 refills | Status: DC | PRN

## 2021-06-12 MED ORDER — ONDANSETRON HCL 4 MG PO TABS: 4.0000 mg | ORAL_TABLET | Freq: Four times a day (QID) | ORAL | 0 refills | Status: DC | PRN

## 2021-06-12 MED ORDER — DOCUSATE SODIUM 100 MG PO CAPS: 100.0000 mg | ORAL_CAPSULE | Freq: Two times a day (BID) | ORAL | 0 refills | Status: DC

## 2021-06-12 MED ORDER — CYCLOBENZAPRINE HCL 10 MG PO TABS: 10.0000 mg | ORAL_TABLET | Freq: Three times a day (TID) | ORAL | 1 refills | Status: DC | PRN

## 2021-06-12 NOTE — Discharge Summary (Signed)
Physician Discharge Summary  Patient ID: Carla Massey MRN: [id number] DOB/AGE: 03-26-1961 60 y.o.  Admit date: 06/11/2021 Discharge date: 06/12/2021  Admission Diagnoses: Lumbar spondylolisthesis, lumbar facet arthropathy, lumbar spinal stenosis, neurogenic claudication, lumbago, lumbar radiculopathy  Discharge Diagnoses: The same Active Problems:   Spondylolisthesis, lumbar region   Discharged Condition: good  Hospital Course: I performed an L4-5 decompression, instrumentation and fusion on the patient on 06/11/2021.  The surgery went well.  The patient's postoperative course was unremarkable.  On postoperative day #1 she requested discharge home.  She was given written and oral discharge instructions.  All her questions were answered.  Consults: PT, OT, care management Significant Diagnostic Studies: None Treatments: L4-5 decompression, instrumentation and fusion. Discharge Exam: Blood pressure 113/61, pulse (!) 105, temperature 98.1 F (36.7 C), temperature source Oral, resp. rate 18, height 5' 2.5" (1.588 m), weight 65.3 kg, last menstrual period 05/15/2015, SpO2 99 %. The patient is alert and pleasant.  She looks well.  Her strength is normal.  Disposition: Home  Discharge Instructions     Call MD for:  difficulty breathing, headache or visual disturbances   Complete by: As directed    Call MD for:  extreme fatigue   Complete by: As directed    Call MD for:  hives   Complete by: As directed    Call MD for:  persistant dizziness or light-headedness   Complete by: As directed    Call MD for:  persistant nausea and vomiting   Complete by: As directed    Call MD for:  redness, tenderness, or signs of infection (pain, swelling, redness, odor or green/yellow discharge around incision site)   Complete by: As directed    Call MD for:  severe uncontrolled pain   Complete by: As directed    Call MD for:  temperature >100.4   Complete by: As directed    Diet - low sodium heart  healthy   Complete by: As directed    Discharge instructions   Complete by: As directed    Call [phone] for a followup appointment. Take a stool softener while you are using pain medications.   Driving Restrictions   Complete by: As directed    Do not drive for 2 weeks.   Increase activity slowly   Complete by: As directed    Lifting restrictions   Complete by: As directed    Do not lift more than 5 pounds. No excessive bending or twisting.   May shower / Bathe   Complete by: As directed    Remove the dressing for 3 days after surgery.  You may shower, but leave the incision alone.   Remove dressing in 48 hours   Complete by: As directed       Allergies as of 06/12/2021       Reactions   Ketoconazole Hives   Nizoral for bacterial infection         Medication List     STOP taking these medications    acetaminophen 500 MG tablet Commonly known as: TYLENOL   celecoxib 200 MG capsule Commonly known as: CELEBREX   ibuprofen 600 MG tablet Commonly known as: ADVIL       TAKE these medications    cyclobenzaprine 10 MG tablet Commonly known as: FLEXERIL Take 1 tablet (10 mg total) by mouth 3 (three) times daily as needed for muscle spasms.   docusate sodium 100 MG capsule Commonly known as: COLACE Take 1 capsule (100 mg total) by mouth  2 (two) times daily.   losartan-hydrochlorothiazide 100-25 MG tablet Commonly known as: HYZAAR Take 1 tablet by mouth daily.   ondansetron 4 MG tablet Commonly known as: ZOFRAN Take 1 tablet (4 mg total) by mouth every 6 (six) hours as needed for nausea or vomiting.   oxyCODONE-acetaminophen 5-325 MG tablet Commonly known as: PERCOCET/ROXICET Take 1-2 tablets by mouth every 4 (four) hours as needed for moderate pain.   zolpidem 10 MG tablet Commonly known as: AMBIEN Take 5 mg by mouth at bedtime as needed for sleep.         Signed: Ophelia Charter 06/12/2021, 8:31 AM

## 2021-06-12 NOTE — Evaluation (Signed)
Occupational Therapy Evaluation Patient Details Name: Carla Massey MRN: [id number] DOB: Sep 27, 1961 Today's Date: 06/12/2021    History of Present Illness 60 yo female s/p L4-5 laminotomy/foraminotomies/medial facetectomy, L4-5 TLIF on 6/29. PMH includes depression, HTN, bilat salpingectomy 2016.   Clinical Impression   Patient admitted for the diagnosis and procedure above.  She lives with her spouse, who is able to assist as needed.  She is essentially at her baseline, and no further OT needs identified in the acute and/or post acute.  All questions answered, and precautions reviewed.      Follow Up Recommendations  No OT follow up    Equipment Recommendations  Tub/shower seat;Other (comment) (LH sponge)    Recommendations for Other Services       Precautions / Restrictions Precautions Precautions: Back Precaution Booklet Issued: Yes (comment) Precaution Comments: BLT rules, log roll in and out of bed Required Braces or Orthoses: Spinal Brace Spinal Brace: Lumbar corset;Applied in sitting position Restrictions Weight Bearing Restrictions: No      Mobility Bed Mobility Overal bed mobility: Modified Independent               Patient Response: Cooperative  Transfers Overall transfer level: Independent                    Balance Overall balance assessment: Mild deficits observed, not formally tested                                         ADL either performed or assessed with clinical judgement   ADL Overall ADL's : At baseline                                             Vision Baseline Vision/History: Wears glasses Patient Visual Report: No change from baseline       Perception     Praxis      Pertinent Vitals/Pain Pain Assessment: Faces Faces Pain Scale: Hurts a little bit Pain Location: back, incisional Pain Descriptors / Indicators: Operative site guarding Pain Intervention(s): Monitored during session      Hand Dominance Right   Extremity/Trunk Assessment Upper Extremity Assessment Upper Extremity Assessment: Overall WFL for tasks assessed   Lower Extremity Assessment Lower Extremity Assessment: Defer to PT evaluation   Cervical / Trunk Assessment Cervical / Trunk Assessment: Other exceptions Cervical / Trunk Exceptions: L spine surgery   Communication Communication Communication: No difficulties   Cognition Arousal/Alertness: Awake/alert Behavior During Therapy: WFL for tasks assessed/performed Overall Cognitive Status: Within Functional Limits for tasks assessed                                                      Home Living Family/patient expects to be discharged to:: Private residence Living Arrangements: Spouse/significant other Available Help at Discharge: Family;Available PRN/intermittently Type of Home: House Home Access: Stairs to enter [employer] of Steps: a few Entrance Stairs-Rails: Right Home Layout: Able to live on main level with bedroom/bathroom         Bathroom Toilet: Standard     Home Equipment: None  Prior Functioning/Environment Level of Independence: Independent        Comments: works at a [employer] Problem List: Impaired balance (sitting and/or standing)      OT Treatment/Interventions:      OT Goals(Current goals can be found in the care plan section) Acute Rehab OT Goals Patient Stated Goal: Return home OT Goal Formulation: With patient Time For Goal Achievement: 06/12/21 Potential to Achieve Goals: Good  OT Frequency:     Barriers to D/C:  None noted          Co-evaluation              AM-PAC OT "6 Clicks" Daily Activity     Outcome Measure Help from another person eating meals?: None Help from another person taking care of personal grooming?: None Help from another person toileting, which includes using toliet, bedpan, or urinal?: None Help from  another person bathing (including washing, rinsing, drying)?: None Help from another person to put on and taking off regular upper body clothing?: None Help from another person to put on and taking off regular lower body clothing?: None 6 Click Score: 24   End of Session Nurse Communication: Mobility status  Activity Tolerance: Patient tolerated treatment well Patient left: in bed  OT Visit Diagnosis: Unsteadiness on feet (R26.81)                Time: 2229-7989 OT Time Calculation (min): 16 min Charges:  OT General Charges $OT Visit: 1 Visit OT Evaluation $OT Eval Moderate Complexity: 1 Mod  06/12/2021  Rich, OTR/L  Acute Rehabilitation Services  Office:  [phone]   Metta Clines 06/12/2021, 9:49 AM

## 2021-06-12 NOTE — Progress Notes (Signed)
NURSING PROGRESS NOTE  Carla Massey [id number] Discharge Data: 06/12/2021 10:02 AM Attending Provider: Newman Pies, MD FXO:VANVBTY, Jenny Reichmann, MD     Clayburn Pert to be D/C'd Home per MD order.  Discussed with the patient the After Visit Summary and all questions fully answered. All IV's discontinued with no bleeding noted. All belongings returned to patient for patient to take home.   Last Vital Signs:  Blood pressure 132/80, pulse 87, temperature 98.3 F (36.8 C), temperature source Oral, resp. rate 17, height 5' 2.5" (1.588 m), weight 65.3 kg, last menstrual period 05/15/2015, SpO2 98 %.  Discharge Medication List Allergies as of 06/12/2021       Reactions   Ketoconazole Hives   Nizoral for bacterial infection         Medication List     STOP taking these medications    acetaminophen 500 MG tablet Commonly known as: TYLENOL   celecoxib 200 MG capsule Commonly known as: CELEBREX   ibuprofen 600 MG tablet Commonly known as: ADVIL       TAKE these medications    cyclobenzaprine 10 MG tablet Commonly known as: FLEXERIL Take 1 tablet (10 mg total) by mouth 3 (three) times daily as needed for muscle spasms.   docusate sodium 100 MG capsule Commonly known as: COLACE Take 1 capsule (100 mg total) by mouth 2 (two) times daily.   losartan-hydrochlorothiazide 100-25 MG tablet Commonly known as: HYZAAR Take 1 tablet by mouth daily.   ondansetron 4 MG tablet Commonly known as: ZOFRAN Take 1 tablet (4 mg total) by mouth every 6 (six) hours as needed for nausea or vomiting.   oxyCODONE-acetaminophen 5-325 MG tablet Commonly known as: PERCOCET/ROXICET Take 1-2 tablets by mouth every 4 (four) hours as needed for moderate pain.   zolpidem 10 MG tablet Commonly known as: AMBIEN Take 5 mg by mouth at bedtime as needed for sleep.

## 2021-06-12 NOTE — Evaluation (Signed)
Physical Therapy Evaluation Patient Details Name: Carla Massey MRN: [id number] DOB: 1961-01-17 Today's Date: 06/12/2021   History of Present Illness  60 yo female s/p L4-5 laminotomy/foraminotomies/medial facetectomy, L4-5 TLIF on 6/29. PMH includes depression, HTN, bilat salpingectomy 2016.  Clinical Impression   Pt presents with [facility] strength, mobility, balance, and applies back precautions well during mobility when cued. Pt ambulatory for great hallway distance without AD or PT assist, pt mod I for all mobility at this time. All education completed, pt with no further acute PT needs. PT to sign off, thank you.      Follow Up Recommendations Supervision for mobility/OOB;Follow surgeon's recommendation for DC plan and follow-up therapies    Equipment Recommendations  None recommended by PT    Recommendations for Other Services       Precautions / Restrictions Precautions Precautions: Back Precaution Booklet Issued: Yes (comment) Precaution Comments: BLT rules, log roll in and out of bed Required Braces or Orthoses: Spinal Brace Spinal Brace: Lumbar corset;Applied in sitting position Restrictions Weight Bearing Restrictions: No      Mobility  Bed Mobility Overal bed mobility: Independent             General bed mobility comments: pt up in room upon PT arrival, verbally reviewed log roll in/out of bed    Transfers Overall transfer level: Needs assistance Equipment used: None Transfers: Sit to/from Stand Sit to Stand: Independent            Ambulation/Gait Ambulation/Gait assistance: Modified independent (Device/Increase time) Gait Distance (Feet): 300 Feet Assistive device: None Gait Pattern/deviations: Step-through pattern;WFL(Within Functional Limits) Gait velocity: slightly decr   General Gait Details: mod I for slightly increased time, verbal cuing for avoiding twisting during directional changes/talking to PT  Stairs Stairs: Yes Stairs assistance:  Supervision Stair Management: One rail Right;Alternating pattern;Forwards Number of Stairs: 10 General stair comments: supervision for safety, cues for step-to pattern but pt assumes step-over-step  Wheelchair Mobility    Modified Rankin (Stroke Patients Only)       Balance Overall balance assessment: Independent                                           Pertinent Vitals/Pain Pain Assessment: Faces Faces Pain Scale: Hurts a little bit Pain Location: back, incisional Pain Descriptors / Indicators: Operative site guarding Pain Intervention(s): Monitored during session    Home Living Family/patient expects to be discharged to:: Private residence Living Arrangements: Spouse/significant other Available Help at Discharge: Family;Available PRN/intermittently Type of Home: House Home Access: Stairs to enter Entrance Stairs-Rails: Right Entrance Stairs-Number of Steps: a few Home Layout: Able to live on main level with bedroom/bathroom Home Equipment: None      Prior Function Level of Independence: Independent         Comments: works at a [employer] traffic [employer] Dominance   Dominant Hand: Right    Extremity/Trunk Assessment   Upper Extremity Assessment Upper Extremity Assessment: Defer to OT evaluation    Lower Extremity Assessment Lower Extremity Assessment: Overall WFL for tasks assessed    Cervical / Trunk Assessment Cervical / Trunk Assessment: Other exceptions Cervical / Trunk Exceptions: L spine surgery  Communication   Communication: No difficulties  Cognition Arousal/Alertness: Awake/alert Behavior During Therapy: WFL for tasks assessed/performed Overall Cognitive Status: Within Functional Limits for tasks assessed  General Comments      Exercises Other Exercises Other Exercises: up and walking 1x/hour during waking hours for short bouts, to prevent post-operative  stiffness, maintain strength   Assessment/Plan    PT Assessment Patent does not need any further PT services  PT Problem List         PT Treatment Interventions      PT Goals (Current goals can be found in the Care Plan section)  Acute Rehab PT Goals Patient Stated Goal: Return home PT Goal Formulation: With patient Time For Goal Achievement: 06/12/21 Potential to Achieve Goals: Good    Frequency     Barriers to discharge        Co-evaluation               AM-PAC PT "6 Clicks" Mobility  Outcome Measure Help needed turning from your back to your side while in a flat bed without using bedrails?: None Help needed moving from lying on your back to sitting on the side of a flat bed without using bedrails?: None Help needed moving to and from a bed to a chair (including a wheelchair)?: None Help needed standing up from a chair using your arms (e.g., wheelchair or bedside chair)?: None Help needed to walk in hospital room?: None Help needed climbing 3-5 steps with a railing? : None 6 Click Score: 24    End of Session Equipment Utilized During Treatment: Back brace Activity Tolerance: Patient tolerated treatment well Patient left: in bed;with call bell/phone within reach Nurse Communication: Mobility status PT Visit Diagnosis: Other abnormalities of gait and mobility (R26.89)    Time: 4403-4742 PT Time Calculation (min) (ACUTE ONLY): 15 min   Charges:   PT Evaluation $PT Eval Low Complexity: 1 Low          Manpreet Kemmer S, PT DPT Acute Rehabilitation Services Pager [phone]  Office [phone]   Cresson E Stroup 06/12/2021, 10:01 AM

## 2021-06-13 MED FILL — Heparin Sodium (Porcine) Inj 1000 Unit/ML: INTRAMUSCULAR | Qty: 30 | Status: AC

## 2021-07-15 ENCOUNTER — Other Ambulatory Visit: Payer: Self-pay | Admitting: Obstetrics and Gynecology

## 2021-07-15 DIAGNOSIS — E2839 Other primary ovarian failure: Secondary | ICD-10-CM

## 2021-10-17 ENCOUNTER — Ambulatory Visit
Admission: RE | Admit: 2021-10-17 | Discharge: 2021-10-17 | Disposition: A | Discharge status: Home / Self Care | Payer: Managed Care, Other (non HMO) | Source: Ambulatory Visit | Attending: Obstetrics and Gynecology | Admitting: Obstetrics and Gynecology

## 2021-10-17 ENCOUNTER — Other Ambulatory Visit: Payer: Self-pay

## 2021-10-17 DIAGNOSIS — E2839 Other primary ovarian failure: Secondary | ICD-10-CM

## 2022-03-25 ENCOUNTER — Other Ambulatory Visit: Payer: Self-pay | Admitting: Obstetrics and Gynecology

## 2022-03-25 DIAGNOSIS — Z1231 Encounter for screening mammogram for malignant neoplasm of breast: Secondary | ICD-10-CM

## 2022-04-03 ENCOUNTER — Ambulatory Visit
Admission: RE | Admit: 2022-04-03 | Discharge: 2022-04-03 | Disposition: A | Discharge status: Home / Self Care | Payer: Managed Care, Other (non HMO) | Source: Ambulatory Visit | Attending: Obstetrics and Gynecology | Admitting: Obstetrics and Gynecology

## 2022-04-03 DIAGNOSIS — Z1231 Encounter for screening mammogram for malignant neoplasm of breast: Secondary | ICD-10-CM

## 2022-04-07 ENCOUNTER — Other Ambulatory Visit: Payer: Self-pay | Admitting: Obstetrics and Gynecology

## 2022-04-07 DIAGNOSIS — R928 Other abnormal and inconclusive findings on diagnostic imaging of breast: Secondary | ICD-10-CM

## 2022-04-10 ENCOUNTER — Ambulatory Visit
Admission: RE | Admit: 2022-04-10 | Discharge: 2022-04-10 | Disposition: A | Discharge status: Home / Self Care | Payer: Managed Care, Other (non HMO) | Source: Ambulatory Visit | Attending: Obstetrics and Gynecology | Admitting: Obstetrics and Gynecology

## 2022-04-10 ENCOUNTER — Ambulatory Visit: Payer: Managed Care, Other (non HMO)

## 2022-04-10 DIAGNOSIS — R928 Other abnormal and inconclusive findings on diagnostic imaging of breast: Secondary | ICD-10-CM

## 2022-04-14 ENCOUNTER — Other Ambulatory Visit: Payer: Managed Care, Other (non HMO)

## 2022-09-24 ENCOUNTER — Ambulatory Visit: Payer: Managed Care, Other (non HMO) | Admitting: Physician Assistant

## 2022-09-24 ENCOUNTER — Encounter: Payer: Self-pay | Admitting: Physician Assistant

## 2022-09-24 VITALS — BP 140/90 | HR 79 | Ht 62.5 in | Wt 143.5 lb

## 2022-09-24 DIAGNOSIS — R0600 Dyspnea, unspecified: Secondary | ICD-10-CM

## 2022-09-24 DIAGNOSIS — R110 Nausea: Secondary | ICD-10-CM | POA: Diagnosis not present

## 2022-09-24 DIAGNOSIS — K219 Gastro-esophageal reflux disease without esophagitis: Secondary | ICD-10-CM | POA: Diagnosis not present

## 2022-09-24 DIAGNOSIS — R142 Eructation: Secondary | ICD-10-CM | POA: Diagnosis not present

## 2022-09-24 DIAGNOSIS — R1013 Epigastric pain: Secondary | ICD-10-CM

## 2022-09-24 MED ORDER — OMEPRAZOLE 40 MG PO CPDR: 40.0000 mg | DELAYED_RELEASE_CAPSULE | Freq: Every day | ORAL | 11 refills | Status: DC

## 2022-09-24 MED ORDER — ONDANSETRON HCL 4 MG PO TABS: 4.0000 mg | ORAL_TABLET | Freq: Three times a day (TID) | ORAL | 11 refills | Status: DC | PRN

## 2022-09-24 NOTE — Patient Instructions (Signed)
You have been scheduled for an abdominal ultrasound at [facility] (1st floor of hospital) on 10/02/22 at 10:30 am . Please arrive  30 minutes prior to your appointment for registration. Make certain not to have anything to eat or drink 6 hours prior to your appointment. Should you need to reschedule your appointment, please contact radiology at [phone]. This test typically takes about 30 minutes to perform.  We have sent the following medications to your pharmacy for you to pick up at your convenience: Omeprazole, Zofran   Start Omeprazole 40 mg once daily 30 mins before breakfast.   Use Zofran 4 mg by mouth every 6 hours as needed for nausea.   You have been scheduled for an endoscopy. Please follow written instructions given to you at your visit today. If you use inhalers (even only as needed), please bring them with you on the day of your procedure.  _______________________________________________________  If you are age 69 or older, your body mass index should be between 23-30. Your Body mass index is 25.83 kg/m. If this is out of the aforementioned range listed, please consider follow up with your Primary Care Provider.  If you are age 2 or younger, your body mass index should be between 19-25. Your Body mass index is 25.83 kg/m. If this is out of the aformentioned range listed, please consider follow up with your Primary Care Provider.   ________________________________________________________  The [facility] providers would like to encourage you to use [facility] to communicate with providers for non-urgent requests or questions.  Due to long hold times on the telephone, sending your provider a message by [facility] may be a faster and more efficient way to get a response.  Please allow 48 business hours for a response.  Please remember that this is for non-urgent requests.  _______________________________________________________  Thank you for choosing me and [facility].  Amy Esterwood PA

## 2022-09-25 ENCOUNTER — Other Ambulatory Visit: Payer: Self-pay

## 2022-09-25 ENCOUNTER — Encounter: Payer: Self-pay | Admitting: Physician Assistant

## 2022-09-25 MED ORDER — PANTOPRAZOLE SODIUM 40 MG PO TBEC: 40.0000 mg | DELAYED_RELEASE_TABLET | Freq: Every day | ORAL | 11 refills | Status: DC

## 2022-09-25 NOTE — Progress Notes (Signed)
Subjective:    Patient ID: Carla Massey, female    DOB: 1961/07/08, 61 y.o.   MRN: [id number]  HPI Carla Massey is a pleasant 61 year old white female, established with Dr. Carlean Purl She was last seen here in 2016. She comes in today with complaints of recent nausea and epigastric pain. She says she has had symptoms over the past couple months and had actually gone to urgent care in August for these complaints.  She says she has a gnawing hungry type of sensation but when she eats then she will become nauseated.  She gets indigestion and feels like she needs to burp all the time to relieve pressure has noticed increase in reflux symptoms as well.  She describes mid upper abdominal discomfort which has never been bad by her report.  No vomiting.  She has some occasional mild dysphagia to solids.  This is not progressed.  Her weight has been stable. No regular aspirin or NSAID use, no regular EtOH. She is status post cholecystectomy. Bowel movements have been normal for her no recent changes, no melena or hematochezia. She has not been on a regular PPI.  She did undergo EGD here in December 2016 which was normal other than a small hiatal hernia. Last colonoscopy February 2014 with a normal appearing colon noted to be very redundant and indicated for 10-year interval follow-up. She is status post hysterectomy and has history of Raynaud's    Review of Systems Pertinent positive and negative review of systems were noted in the above HPI section.  All other review of systems was otherwise negative.   Outpatient Encounter Medications as of 09/24/2022  Medication Sig   Estradiol 10 MCG TABS vaginal tablet Place 10 mg vaginally 2 (two) times a week.   losartan-hydrochlorothiazide (HYZAAR) 100-25 MG tablet Take 1 tablet by mouth daily.   ondansetron (ZOFRAN) 4 MG tablet Take 1 tablet (4 mg total) by mouth every 8 (eight) hours as needed for nausea or vomiting.   zolpidem (AMBIEN) 10 MG tablet Take 5 mg by  mouth at bedtime as needed for sleep.   [DISCONTINUED] omeprazole (PRILOSEC) 40 MG capsule Take 1 capsule (40 mg total) by mouth daily.   [DISCONTINUED] cyclobenzaprine (FLEXERIL) 10 MG tablet Take 1 tablet (10 mg total) by mouth 3 (three) times daily as needed for muscle spasms.   [DISCONTINUED] docusate sodium (COLACE) 100 MG capsule Take 1 capsule (100 mg total) by mouth 2 (two) times daily.   [DISCONTINUED] ondansetron (ZOFRAN) 4 MG tablet Take 1 tablet (4 mg total) by mouth every 6 (six) hours as needed for nausea or vomiting.   [DISCONTINUED] oxyCODONE-acetaminophen (PERCOCET/ROXICET) 5-325 MG tablet Take 1-2 tablets by mouth every 4 (four) hours as needed for moderate pain.   No facility-administered encounter medications on file as of 09/24/2022.   Allergies  Allergen Reactions   Ketoconazole Hives    Nizoral for bacterial infection    Patient Active Problem List   Diagnosis Date Noted   Spondylolisthesis, lumbar region 06/11/2021   Anemia, iron deficiency 10/30/2015   Cervical spondylosis without myelopathy 10/30/2015   Status post laparoscopic assisted vaginal hysterectomy (LAVH) 05/21/2015   Esophageal reflux 05/15/2011   Raynauds phenomenon 05/15/2011   Chest pain, mid sternal 05/15/2011   Social History   Socioeconomic History   Marital status: Married    Spouse name: Not on file   Number of children: 1   Years of education: HS   Highest education level: Not on file  Occupational History  Occupation: Therapist, sports: [employer]    Comment: Dental Practice  Tobacco Use   Smoking status: Never   Smokeless tobacco: Never  Substance and Sexual Activity   Alcohol use: No    Alcohol/week: 0.0 standard drinks of alcohol   Drug use: No   Sexual activity: Yes  Other Topics Concern   Not on file  Social History Narrative   Lives at home with husband.   Right-handed.   16oz caffeine daily.   Social Determinants of Health   Financial Resource  Strain: Not on file  Food Insecurity: Not on file  Transportation Needs: Not on file  Physical Activity: Not on file  Stress: Not on file  Social Connections: Not on file  Intimate Partner Violence: Not on file    [name]'s family history includes Breast cancer (age of onset: 19) in her mother; Hypertension in her father and mother; Thyroid cancer in her mother.      Objective:    Vitals:   09/24/22 1417  BP: (!) 140/90  Pulse: 79  SpO2: 100%    Physical Exam Well-developed well-nourished older white female in no acute distress.  Height, Weight, 143 BMI 25.8  HEENT; nontraumatic normocephalic, EOMI, PE R LA, sclera anicteric. Oropharynx; not examined today Neck; supple, no JVD Cardiovascular; regular rate and rhythm with S1-S2, no murmur rub or gallop Pulmonary; Clear bilaterally Abdomen; soft, nontender, nondistended, no palpable mass or hepatosplenomegaly, bowel sounds are active Rectal; not done today Skin; benign exam, no jaundice rash or appreciable lesions Extremities; no clubbing cyanosis or edema skin warm and dry Neuro/Psych; alert and oriented x4, grossly nonfocal mood and affect appropriate        Assessment & Plan:   #69 61 year old white female with 17-monthhistory of upper abdominal discomfort/dyspepsia, postprandial nausea and increase in indigestion burping and reflux type symptoms.  Prior EGD 2016 small hiatal hernia otherwise negative  Etiology of current symptoms not entirely clear.  Rule out gastritis,, H. pylori, peptic ulcer disease, nonulcer dyspepsia, poorly controlled GERD Consider gallbladder disease  #2 colon cancer screening-last colonoscopy February 2014 negative, redundant colon Indicated for 10-year interval follow-up February 2024  #3 status post hysterectomy 4.  History of Raynaud's  Plan; patient will be scheduled for upper abdominal ultrasound Patient will be scheduled for upper endoscopy with Dr. GCarlean Purl  Procedure was  discussed in detail with patient including indications risk and benefits and she is agreeable to proceed Start omeprazole 40 mg p.o. every morning AC breakfast Zofran 4 mg every 6 hours as needed for nausea  Further recommendations pending results of above.  Pt will need colonoscopy 2024    AAlfredia FergusonPA-C 09/25/2022   Cc: GSharilyn Sites MD

## 2022-10-02 ENCOUNTER — Ambulatory Visit (HOSPITAL_COMMUNITY)
Admission: RE | Admit: 2022-10-02 | Discharge: 2022-10-02 | Disposition: A | Discharge status: Home / Self Care | Payer: Managed Care, Other (non HMO) | Source: Ambulatory Visit | Attending: Physician Assistant | Admitting: Physician Assistant

## 2022-10-02 DIAGNOSIS — R0600 Dyspnea, unspecified: Secondary | ICD-10-CM | POA: Diagnosis present

## 2022-10-02 DIAGNOSIS — R110 Nausea: Secondary | ICD-10-CM | POA: Diagnosis present

## 2022-10-02 DIAGNOSIS — R142 Eructation: Secondary | ICD-10-CM | POA: Insufficient documentation

## 2022-10-02 DIAGNOSIS — R1013 Epigastric pain: Secondary | ICD-10-CM | POA: Insufficient documentation

## 2022-10-02 DIAGNOSIS — K219 Gastro-esophageal reflux disease without esophagitis: Secondary | ICD-10-CM | POA: Insufficient documentation

## 2022-10-30 ENCOUNTER — Encounter: Payer: Managed Care, Other (non HMO) | Admitting: Internal Medicine

## 2022-11-10 ENCOUNTER — Other Ambulatory Visit: Payer: Self-pay

## 2022-11-10 MED ORDER — ONDANSETRON HCL 4 MG PO TABS
4.0000 mg | ORAL_TABLET | Freq: Three times a day (TID) | ORAL | 0 refills | Status: DC | PRN
Start: 2022-11-10 — End: 2023-09-21

## 2022-12-17 ENCOUNTER — Ambulatory Visit: Payer: Self-pay | Admitting: General Surgery

## 2022-12-17 DIAGNOSIS — K8020 Calculus of gallbladder without cholecystitis without obstruction: Secondary | ICD-10-CM

## 2022-12-17 NOTE — Progress Notes (Signed)
Sent message, via epic in basket, requesting orders in epic from surgeon.  

## 2022-12-18 NOTE — Patient Instructions (Signed)
SURGICAL WAITING ROOM VISITATION  Patients having surgery or a procedure may have no more than 2 support people in the waiting area - these visitors may rotate.    Children under the age of 101 must have an adult with them who is not the patient.  Due to an increase in RSV and influenza rates and associated hospitalizations, children ages 20 and under may not visit patients in [facility] [place].  If the patient needs to stay at the hospital during part of their recovery, the visitor guidelines for inpatient rooms apply. Pre-op nurse will coordinate an appropriate time for 1 support person to accompany patient in pre-op.  This support person may not rotate.    Please refer to the [facility] website for the visitor guidelines for Inpatients (after your surgery is over and you are in a regular room).    Your procedure is scheduled on: 12/24/22   Report to [facility] Main Entrance    Report to admitting at 5:15 AM   Call this number if you have problems the morning of surgery [phone]   Do not eat food :After Midnight.   After Midnight you may have the following liquids until 4:30 AM DAY OF SURGERY  Water Non-Citrus Juices (without pulp, NO RED-Apple, White grape, White cranberry) Black Coffee (NO MILK/CREAM OR CREAMERS, sugar ok)  Clear Tea (NO MILK/CREAM OR CREAMERS, sugar ok) regular and decaf                             Plain Jell-O (NO RED)                                           Fruit ices (not with fruit pulp, NO RED)                                     Popsicles (NO RED)                                                               Sports drinks like Gatorade (NO RED)               The day of surgery:  Drink ONE (1) Pre-Surgery Clear Ensure at 4:30 AM the morning of surgery. Drink in one sitting. Do not sip.  This drink was given to you during your hospital  pre-op appointment visit. Nothing else to drink after completing the  Pre-Surgery Clear Ensure.           If you have questions, please contact your surgeon's office.   FOLLOW BOWEL PREP AND ANY ADDITIONAL PRE OP INSTRUCTIONS YOU RECEIVED FROM YOUR SURGEON'S OFFICE!!!     Oral Hygiene is also important to reduce your risk of infection.                                    Remember - BRUSH YOUR TEETH THE MORNING OF SURGERY WITH YOUR REGULAR TOOTHPASTE  DENTURES WILL BE REMOVED  PRIOR TO SURGERY PLEASE DO NOT APPLY "Poly grip" OR ADHESIVES!!!   Take these medicines the morning of surgery with A SIP OF WATER: Pantoprazole, Zofran                              You may not have any metal on your body including hair pins, jewelry, and body piercing             Do not wear make-up, lotions, powders, perfumes, or deodorant  Do not wear nail polish including gel and S&S, artificial/acrylic nails, or any other type of covering on natural nails including finger and toenails. If you have artificial nails, gel coating, etc. that needs to be removed by a nail salon please have this removed prior to surgery or surgery may need to be canceled/ delayed if the surgeon/ anesthesia feels like they are unable to be safely monitored.   Do not shave  48 hours prior to surgery.    Do not bring valuables to the hospital. [place].   Contacts, glasses, dentures or bridgework may not be worn into surgery.  DO NOT [place]. PHARMACY WILL DISPENSE MEDICATIONS LISTED ON YOUR MEDICATION LIST TO YOU DURING YOUR ADMISSION [place]!    Patients discharged on the day of surgery will not be allowed to drive home.  Someone NEEDS to stay with you for the first 24 hours after anesthesia.   Special Instructions: Bring a copy of your healthcare power of attorney and living will documents the day of surgery if you haven't scanned them before.              Please read over the following fact sheets you were given: IF [place] [id number] Schneiders    If you received a COVID test during your pre-op visit  it is requested that you wear a mask when out in public, stay away from anyone that may not be feeling well and notify your surgeon if you develop symptoms. If you test positive for Covid or have been in contact with anyone that has tested positive in the last 10 days please notify you surgeon.    [facility] - Preparing for Surgery Before surgery, you can play an important role.  Because skin is not sterile, your skin needs to be as free of germs as possible.  You can reduce the number of germs on your skin by washing with CHG (chlorahexidine gluconate) soap before surgery.  CHG is an antiseptic cleaner which kills germs and bonds with the skin to continue killing germs even after washing. Please DO NOT use if you have an allergy to CHG or antibacterial soaps.  If your skin becomes reddened/irritated stop using the CHG and inform your nurse when you arrive at Short Stay. Do not shave (including legs and underarms) for at least 48 hours prior to the first CHG shower.  You may shave your face/neck.  Please follow these instructions carefully:  1.  Shower with CHG Soap the night before surgery and the  morning of surgery.  2.  If you choose to wash your hair, wash your hair first as usual with your normal  shampoo.  3.  After you shampoo, rinse your hair and body thoroughly to remove  the shampoo.                             4.  Use CHG as you would any other liquid soap.  You can apply chg directly to the skin and wash.  Gently with a scrungie or clean washcloth.  5.  Apply the CHG Soap to your body ONLY FROM THE NECK DOWN.   Do   not use on face/ open                           Wound or open sores. Avoid contact with eyes, ears mouth and   genitals (private parts).                       Wash face,  Genitals (private parts) with your normal soap.             6.  Wash  thoroughly, paying special attention to the area where your    surgery  will be performed.  7.  Thoroughly rinse your body with warm water from the neck down.  8.  DO NOT shower/wash with your normal soap after using and rinsing off the CHG Soap.                9.  Pat yourself dry with a clean towel.            10.  Wear clean pajamas.            11.  Place clean sheets on your bed the night of your first shower and do not  sleep with pets. Day of Surgery : Do not apply any lotions/deodorants the morning of surgery.  Please wear clean clothes to the hospital/surgery center.  FAILURE TO FOLLOW THESE INSTRUCTIONS MAY RESULT IN THE CANCELLATION OF YOUR SURGERY  PATIENT SIGNATURE_________________________________  NURSE SIGNATURE__________________________________  ________________________________________________________________________

## 2022-12-18 NOTE — Progress Notes (Signed)
COVID Vaccine Completed:  Date of COVID positive in last 90 days:  PCP - Sharilyn Sites, MD Cardiologist -   Chest x-ray -  EKG -  Stress Test -  ECHO -  Cardiac Cath -  Pacemaker/ICD device last checked: Spinal Cord Stimulator:  Bowel Prep -   Sleep Study -  CPAP -   Fasting Blood Sugar -  Checks Blood Sugar _____ times a day  Last dose of GLP1 agonist-  N/A GLP1 instructions:  N/A   Last dose of SGLT-2 inhibitors-  N/A SGLT-2 instructions: N/A   Blood Thinner Instructions: Aspirin Instructions: Last Dose:  Activity level:  Can go up a flight of stairs and perform activities of daily living without stopping and without symptoms of chest pain or shortness of breath.  Able to exercise without symptoms  Unable to go up a flight of stairs without symptoms of     Anesthesia review:   Patient denies shortness of breath, fever, cough and chest pain at PAT appointment  Patient verbalized understanding of instructions that were given to them at the PAT appointment. Patient was also instructed that they will need to review over the PAT instructions again at home before surgery.

## 2022-12-21 ENCOUNTER — Encounter (HOSPITAL_COMMUNITY)
Admission: RE | Admit: 2022-12-21 | Discharge: 2022-12-21 | Disposition: A | Discharge status: Home / Self Care | Payer: Managed Care, Other (non HMO) | Source: Ambulatory Visit | Attending: General Surgery | Admitting: General Surgery

## 2022-12-21 ENCOUNTER — Encounter (HOSPITAL_COMMUNITY): Payer: Self-pay

## 2022-12-21 VITALS — BP 114/78 | HR 78 | Temp 97.7°F | Resp 12 | Ht 62.0 in | Wt 142.0 lb

## 2022-12-21 DIAGNOSIS — I10 Essential (primary) hypertension: Secondary | ICD-10-CM | POA: Diagnosis not present

## 2022-12-21 DIAGNOSIS — Z01818 Encounter for other preprocedural examination: Secondary | ICD-10-CM | POA: Diagnosis not present

## 2022-12-21 DIAGNOSIS — K8020 Calculus of gallbladder without cholecystitis without obstruction: Secondary | ICD-10-CM | POA: Diagnosis not present

## 2022-12-21 HISTORY — DX: Headache, unspecified: R51.9

## 2022-12-21 LAB — COMPREHENSIVE METABOLIC PANEL
ALT: 23 U/L (ref 0–44)
AST: 22 U/L (ref 15–41)
Albumin: 3.7 g/dL (ref 3.5–5.0)
Alkaline Phosphatase: 62 U/L (ref 38–126)
Anion gap: 9 (ref 5–15)
BUN: 9 mg/dL (ref 6–20)
CO2: 28 mmol/L (ref 22–32)
Calcium: 8.8 mg/dL — ABNORMAL LOW (ref 8.9–10.3)
Chloride: 92 mmol/L — ABNORMAL LOW (ref 98–111)
Creatinine, Ser: 0.58 mg/dL (ref 0.44–1.00)
GFR, Estimated: >60 mL/min (ref >60)
Glucose, Bld: 89 mg/dL (ref 70–99)
Potassium: 3 mmol/L — ABNORMAL LOW (ref 3.5–5.1)
Sodium: 129 mmol/L — ABNORMAL LOW (ref 135–145)
Total Bilirubin: 0.7 mg/dL (ref 0.3–1.2)
Total Protein: 6.6 g/dL (ref 6.5–8.1)

## 2022-12-21 LAB — CBC
HCT: 39.5 % (ref 36.0–46.0)
Hemoglobin: 13.2 g/dL (ref 12.0–15.0)
MCH: 29.1 pg (ref 26.0–34.0)
MCHC: 33.4 g/dL (ref 30.0–36.0)
MCV: 87.2 fL (ref 80.0–100.0)
Platelets: 335 10*3/uL (ref 150–400)
RBC: 4.53 MIL/uL (ref 3.87–5.11)
RDW: 12.7 % (ref 11.5–15.5)
WBC: 5.9 10*3/uL (ref 4.0–10.5)
nRBC: 0 % (ref 0.0–0.2)

## 2022-12-21 NOTE — Progress Notes (Signed)
Na+ 129, results routed to Dr. Redmond Pulling.

## 2022-12-23 NOTE — Anesthesia Preprocedure Evaluation (Addendum)
Anesthesia Evaluation  Patient identified by MRN, date of birth, ID band Patient awake    Reviewed: Allergy & Precautions, NPO status , Patient's Chart, lab work & pertinent test results  Airway Mallampati: II  TM Distance: >3 FB Neck ROM: Full    Dental  (+) Dental Advisory Given   Pulmonary neg pulmonary ROS   breath sounds clear to auscultation       Cardiovascular hypertension, Pt. on medications  Rhythm:Regular Rate:Normal     Neuro/Psych  Headaches    GI/Hepatic Neg liver ROS, hiatal hernia,GERD  ,,  Endo/Other  negative endocrine ROS    Renal/GU negative Renal ROS     Musculoskeletal  (+) Arthritis ,    Abdominal   Peds  Hematology  (+) Blood dyscrasia, anemia   Anesthesia Other Findings   Reproductive/Obstetrics                             Anesthesia Physical Anesthesia Plan  ASA: 2  Anesthesia Plan: General   Post-op Pain Management: Tylenol PO (pre-op)* and Toradol IV (intra-op)*   Induction: Intravenous  PONV Risk Score and Plan: 4 or greater and Midazolam, Dexamethasone, Ondansetron and Treatment may vary due to age or medical condition  Airway Management Planned: Oral ETT  Additional Equipment:   Intra-op Plan:   Post-operative Plan: Extubation in OR  Informed Consent: I have reviewed the patients History and Physical, chart, labs and discussed the procedure including the risks, benefits and alternatives for the proposed anesthesia with the patient or authorized representative who has indicated his/her understanding and acceptance.     Dental advisory given  Plan Discussed with: CRNA  Anesthesia Plan Comments:        Anesthesia Quick Evaluation

## 2022-12-24 ENCOUNTER — Ambulatory Visit (HOSPITAL_COMMUNITY): Payer: Managed Care, Other (non HMO) | Admitting: Physician Assistant

## 2022-12-24 ENCOUNTER — Ambulatory Visit (HOSPITAL_COMMUNITY)
Admission: RE | Admit: 2022-12-24 | Discharge: 2022-12-24 | Disposition: A | Discharge status: Home / Self Care | Payer: Managed Care, Other (non HMO) | Source: Ambulatory Visit | Attending: General Surgery | Admitting: General Surgery

## 2022-12-24 ENCOUNTER — Other Ambulatory Visit: Payer: Self-pay

## 2022-12-24 ENCOUNTER — Encounter (HOSPITAL_COMMUNITY)
Admission: RE | Disposition: A | Discharge status: Home / Self Care | Payer: Self-pay | Source: Ambulatory Visit | Attending: General Surgery

## 2022-12-24 ENCOUNTER — Ambulatory Visit (HOSPITAL_BASED_OUTPATIENT_CLINIC_OR_DEPARTMENT_OTHER): Payer: Managed Care, Other (non HMO) | Admitting: Anesthesiology

## 2022-12-24 ENCOUNTER — Encounter (HOSPITAL_COMMUNITY): Payer: Self-pay | Admitting: General Surgery

## 2022-12-24 DIAGNOSIS — Z79899 Other long term (current) drug therapy: Secondary | ICD-10-CM | POA: Diagnosis not present

## 2022-12-24 DIAGNOSIS — K8010 Calculus of gallbladder with chronic cholecystitis without obstruction: Secondary | ICD-10-CM | POA: Diagnosis not present

## 2022-12-24 DIAGNOSIS — K8020 Calculus of gallbladder without cholecystitis without obstruction: Secondary | ICD-10-CM

## 2022-12-24 DIAGNOSIS — I10 Essential (primary) hypertension: Secondary | ICD-10-CM | POA: Diagnosis not present

## 2022-12-24 HISTORY — PX: CHOLECYSTECTOMY: SHX55

## 2022-12-24 SURGERY — LAPAROSCOPIC CHOLECYSTECTOMY
Anesthesia: General

## 2022-12-24 MED ORDER — BUPIVACAINE-EPINEPHRINE 0.5% -1:200000 IJ SOLN
INTRAMUSCULAR | Status: DC | PRN
  Administered 2022-12-24: 30 mL

## 2022-12-24 MED ORDER — CHLORHEXIDINE GLUCONATE CLOTH 2 % EX PADS: 6.0000 | MEDICATED_PAD | Freq: Once | CUTANEOUS | Status: DC

## 2022-12-24 MED ORDER — LIDOCAINE HCL (PF) 2 % IJ SOLN
INTRAMUSCULAR | Status: AC
  Filled 2022-12-24: qty 5

## 2022-12-24 MED ORDER — LABETALOL HCL 5 MG/ML IV SOLN
INTRAVENOUS | Status: AC
  Filled 2022-12-24: qty 4

## 2022-12-24 MED ORDER — ROCURONIUM BROMIDE 10 MG/ML (PF) SYRINGE
PREFILLED_SYRINGE | INTRAVENOUS | Status: AC
  Filled 2022-12-24: qty 10

## 2022-12-24 MED ORDER — LIDOCAINE HCL (CARDIAC) PF 100 MG/5ML IV SOSY
PREFILLED_SYRINGE | INTRAVENOUS | Status: DC | PRN
  Administered 2022-12-24: 50 mg via INTRAVENOUS

## 2022-12-24 MED ORDER — OXYCODONE HCL 5 MG PO TABS: 5.0000 mg | ORAL_TABLET | Freq: Four times a day (QID) | ORAL | 0 refills | Status: DC | PRN

## 2022-12-24 MED ORDER — FENTANYL CITRATE PF 50 MCG/ML IJ SOSY
PREFILLED_SYRINGE | INTRAMUSCULAR | Status: AC
  Administered 2022-12-24: 50 ug via INTRAVENOUS
  Filled 2022-12-24: qty 1

## 2022-12-24 MED ORDER — ONDANSETRON HCL 4 MG/2ML IJ SOLN
INTRAMUSCULAR | Status: AC
  Filled 2022-12-24: qty 2

## 2022-12-24 MED ORDER — MIDAZOLAM HCL 2 MG/2ML IJ SOLN
INTRAMUSCULAR | Status: AC
  Filled 2022-12-24: qty 2

## 2022-12-24 MED ORDER — FENTANYL CITRATE (PF) 250 MCG/5ML IJ SOLN
INTRAMUSCULAR | Status: AC
  Filled 2022-12-24: qty 5

## 2022-12-24 MED ORDER — FENTANYL CITRATE PF 50 MCG/ML IJ SOSY
PREFILLED_SYRINGE | INTRAMUSCULAR | Status: AC
  Filled 2022-12-24: qty 1

## 2022-12-24 MED ORDER — KETOROLAC TROMETHAMINE 30 MG/ML IJ SOLN
INTRAMUSCULAR | Status: AC
  Filled 2022-12-24: qty 1

## 2022-12-24 MED ORDER — SCOPOLAMINE 1 MG/3DAYS TD PT72
1.0000 | MEDICATED_PATCH | TRANSDERMAL | Status: DC
  Filled 2022-12-24: qty 1

## 2022-12-24 MED ORDER — FENTANYL CITRATE PF 50 MCG/ML IJ SOSY
25.0000 ug | PREFILLED_SYRINGE | INTRAMUSCULAR | Status: DC | PRN
  Administered 2022-12-24: 25 ug via INTRAVENOUS
  Administered 2022-12-24: 50 ug via INTRAVENOUS
  Administered 2022-12-24: 25 ug via INTRAVENOUS

## 2022-12-24 MED ORDER — CHLORHEXIDINE GLUCONATE 0.12 % MT SOLN
15.0000 mL | Freq: Once | OROMUCOSAL | Status: AC
  Administered 2022-12-24: 15 mL via OROMUCOSAL

## 2022-12-24 MED ORDER — MIDAZOLAM HCL 5 MG/5ML IJ SOLN
INTRAMUSCULAR | Status: DC | PRN
  Administered 2022-12-24 (×2): 1 mg via INTRAVENOUS

## 2022-12-24 MED ORDER — ACETAMINOPHEN 500 MG PO TABS: 1000.0000 mg | ORAL_TABLET | Freq: Three times a day (TID) | ORAL | 0 refills | Status: AC

## 2022-12-24 MED ORDER — KETOROLAC TROMETHAMINE 15 MG/ML IJ SOLN
INTRAMUSCULAR | Status: DC | PRN
  Administered 2022-12-24: 15 mg via INTRAVENOUS

## 2022-12-24 MED ORDER — OXYCODONE HCL 5 MG/5ML PO SOLN: 5.0000 mg | Freq: Once | ORAL | Status: AC | PRN

## 2022-12-24 MED ORDER — DEXAMETHASONE SODIUM PHOSPHATE 10 MG/ML IJ SOLN
INTRAMUSCULAR | Status: DC | PRN
  Administered 2022-12-24: 8 mg via INTRAVENOUS

## 2022-12-24 MED ORDER — BUPIVACAINE-EPINEPHRINE (PF) 0.5% -1:200000 IJ SOLN
INTRAMUSCULAR | Status: AC
  Filled 2022-12-24: qty 30

## 2022-12-24 MED ORDER — PROPOFOL 500 MG/50ML IV EMUL
INTRAVENOUS | Status: DC | PRN
  Administered 2022-12-24: 20 ug/kg/min via INTRAVENOUS

## 2022-12-24 MED ORDER — PHENYLEPHRINE 80 MCG/ML (10ML) SYRINGE FOR IV PUSH (FOR BLOOD PRESSURE SUPPORT)
PREFILLED_SYRINGE | INTRAVENOUS | Status: DC | PRN
  Administered 2022-12-24: 80 ug via INTRAVENOUS

## 2022-12-24 MED ORDER — ROCURONIUM BROMIDE 100 MG/10ML IV SOLN
INTRAVENOUS | Status: DC | PRN
  Administered 2022-12-24: 40 mg via INTRAVENOUS

## 2022-12-24 MED ORDER — LABETALOL HCL 5 MG/ML IV SOLN
INTRAVENOUS | Status: DC | PRN
  Administered 2022-12-24: 2.5 mg via INTRAVENOUS

## 2022-12-24 MED ORDER — SUGAMMADEX SODIUM 200 MG/2ML IV SOLN
INTRAVENOUS | Status: DC | PRN
  Administered 2022-12-24: 200 mg via INTRAVENOUS

## 2022-12-24 MED ORDER — LACTATED RINGERS IR SOLN
Status: DC | PRN
  Administered 2022-12-24: 1000 mL

## 2022-12-24 MED ORDER — DEXMEDETOMIDINE HCL IN NACL 80 MCG/20ML IV SOLN
INTRAVENOUS | Status: DC | PRN
  Administered 2022-12-24: 8 ug via BUCCAL

## 2022-12-24 MED ORDER — LACTATED RINGERS IV SOLN: INTRAVENOUS | Status: DC

## 2022-12-24 MED ORDER — PROPOFOL 10 MG/ML IV BOLUS
INTRAVENOUS | Status: AC
  Filled 2022-12-24: qty 20

## 2022-12-24 MED ORDER — FENTANYL CITRATE (PF) 100 MCG/2ML IJ SOLN
INTRAMUSCULAR | Status: DC | PRN
  Administered 2022-12-24 (×2): 50 ug via INTRAVENOUS
  Administered 2022-12-24 (×2): 25 ug via INTRAVENOUS
  Administered 2022-12-24: 50 ug via INTRAVENOUS
  Administered 2022-12-24 (×2): 25 ug via INTRAVENOUS

## 2022-12-24 MED ORDER — DEXAMETHASONE SODIUM PHOSPHATE 10 MG/ML IJ SOLN
INTRAMUSCULAR | Status: AC
  Filled 2022-12-24: qty 1

## 2022-12-24 MED ORDER — PROPOFOL 10 MG/ML IV BOLUS
INTRAVENOUS | Status: DC | PRN
  Administered 2022-12-24: 20 mg via INTRAVENOUS
  Administered 2022-12-24 (×2): 150 mg via INTRAVENOUS

## 2022-12-24 MED ORDER — ORAL CARE MOUTH RINSE: 15.0000 mL | Freq: Once | OROMUCOSAL | Status: AC

## 2022-12-24 MED ORDER — 0.9 % SODIUM CHLORIDE (POUR BTL) OPTIME
TOPICAL | Status: DC | PRN
  Administered 2022-12-24: 1000 mL

## 2022-12-24 MED ORDER — OXYCODONE HCL 5 MG PO TABS: 5.0000 mg | ORAL_TABLET | Freq: Once | ORAL | Status: AC | PRN

## 2022-12-24 MED ORDER — ACETAMINOPHEN 500 MG PO TABS
1000.0000 mg | ORAL_TABLET | ORAL | Status: AC
  Administered 2022-12-24: 1000 mg via ORAL
  Filled 2022-12-24: qty 2

## 2022-12-24 MED ORDER — AMISULPRIDE (ANTIEMETIC) 5 MG/2ML IV SOLN: 10.0000 mg | Freq: Once | INTRAVENOUS | Status: DC | PRN

## 2022-12-24 MED ORDER — OXYCODONE HCL 5 MG PO TABS
ORAL_TABLET | ORAL | Status: AC
  Administered 2022-12-24: 5 mg via ORAL
  Filled 2022-12-24: qty 2

## 2022-12-24 MED ORDER — SODIUM CHLORIDE 0.9 % IV SOLN
2.0000 g | INTRAVENOUS | Status: AC
  Administered 2022-12-24: 2 g via INTRAVENOUS
  Filled 2022-12-24: qty 2

## 2022-12-24 MED ORDER — PHENYLEPHRINE 80 MCG/ML (10ML) SYRINGE FOR IV PUSH (FOR BLOOD PRESSURE SUPPORT)
PREFILLED_SYRINGE | INTRAVENOUS | Status: AC
  Filled 2022-12-24: qty 10

## 2022-12-24 MED ORDER — OXYCODONE HCL 5 MG PO TABS
ORAL_TABLET | ORAL | Status: AC
  Filled 2022-12-24: qty 1

## 2022-12-24 MED ORDER — ONDANSETRON HCL 4 MG/2ML IJ SOLN
INTRAMUSCULAR | Status: DC | PRN
  Administered 2022-12-24: 4 mg via INTRAVENOUS

## 2022-12-24 SURGICAL SUPPLY — 57 items
ADH SKN CLS APL DERMABOND .7 (GAUZE/BANDAGES/DRESSINGS)
APL PRP STRL LF DISP 70% ISPRP (MISCELLANEOUS) ×1
APL SRG 38 LTWT LNG FL B (MISCELLANEOUS)
APPLICATOR ARISTA FLEXITIP XL (MISCELLANEOUS) IMPLANT
APPLIER CLIP 5 13 M/L LIGAMAX5 (MISCELLANEOUS) ×1
APPLIER CLIP ROT 10 11.4 M/L (STAPLE)
APR CLP MED LRG 11.4X10 (STAPLE)
APR CLP MED LRG 5 ANG JAW (MISCELLANEOUS) ×1
BAG COUNTER SPONGE SURGICOUNT (BAG) IMPLANT
BAG SPEC RTRVL 10 TROC 200 (ENDOMECHANICALS) ×1
BAG SPNG CNTER NS LX DISP (BAG)
CABLE HIGH FREQUENCY MONO STRZ (ELECTRODE) ×2 IMPLANT
CHLORAPREP W/TINT 26 (MISCELLANEOUS) ×2 IMPLANT
CLIP APPLIE 5 13 M/L LIGAMAX5 (MISCELLANEOUS) IMPLANT
CLIP APPLIE ROT 10 11.4 M/L (STAPLE) IMPLANT
CLIP LIGATING HEMO O LOK GREEN (MISCELLANEOUS) IMPLANT
COVER MAYO STAND XLG (MISCELLANEOUS) IMPLANT
COVER SURGICAL LIGHT HANDLE (MISCELLANEOUS) ×2 IMPLANT
DERMABOND ADVANCED .7 DNX12 (GAUZE/BANDAGES/DRESSINGS) IMPLANT
DRAPE C-ARM 42X120 X-RAY (DRAPES) IMPLANT
DRSG TEGADERM 2-3/8X2-3/4 SM (GAUZE/BANDAGES/DRESSINGS) ×6 IMPLANT
DRSG TEGADERM 4X4.75 (GAUZE/BANDAGES/DRESSINGS) ×2 IMPLANT
ELECT REM PT RETURN 15FT ADLT (MISCELLANEOUS) ×2 IMPLANT
GAUZE SPONGE 2X2 STRL 8-PLY (GAUZE/BANDAGES/DRESSINGS) ×2 IMPLANT
GLOVE BIO SURGEON STRL SZ7.5 (GLOVE) ×2 IMPLANT
GLOVE INDICATOR 8.0 STRL GRN (GLOVE) ×2 IMPLANT
GOWN STRL REUS W/ TWL XL LVL3 (GOWN DISPOSABLE) ×2 IMPLANT
GOWN STRL REUS W/TWL XL LVL3 (GOWN DISPOSABLE) ×1
GRASPER SUT TROCAR 14GX15 (MISCELLANEOUS) IMPLANT
HEMOSTAT ARISTA ABSORB 3G PWDR (HEMOSTASIS) IMPLANT
HEMOSTAT SNOW SURGICEL 2X4 (HEMOSTASIS) IMPLANT
IRRIG SUCT STRYKERFLOW 2 WTIP (MISCELLANEOUS) ×1
IRRIGATION SUCT STRKRFLW 2 WTP (MISCELLANEOUS) ×2 IMPLANT
KIT BASIN OR (CUSTOM PROCEDURE TRAY) ×2 IMPLANT
KIT TURNOVER KIT A (KITS) IMPLANT
L-HOOK LAP DISP 36CM (ELECTROSURGICAL)
LHOOK LAP DISP 36CM (ELECTROSURGICAL) IMPLANT
POUCH RETRIEVAL ECOSAC 10 (ENDOMECHANICALS) ×2 IMPLANT
POUCH RETRIEVAL ECOSAC 10MM (ENDOMECHANICALS) ×1
SCISSORS LAP 5X35 DISP (ENDOMECHANICALS) ×2 IMPLANT
SET CHOLANGIOGRAPH MIX (MISCELLANEOUS) IMPLANT
SET TUBE SMOKE EVAC HIGH FLOW (TUBING) ×2 IMPLANT
SLEEVE Z-THREAD 5X100MM (TROCAR) ×4 IMPLANT
SPIKE FLUID TRANSFER (MISCELLANEOUS) ×2 IMPLANT
SPONGE GAUZE 2X2 8PLY STRL LF (GAUZE/BANDAGES/DRESSINGS) IMPLANT
STRIP CLOSURE SKIN 1/2X4 (GAUZE/BANDAGES/DRESSINGS) ×2 IMPLANT
SUT MNCRL AB 4-0 PS2 18 (SUTURE) ×2 IMPLANT
SUT VIC AB 0 UR5 27 (SUTURE) IMPLANT
SUT VICRYL 0 TIES 12 18 (SUTURE) IMPLANT
SUT VICRYL 0 UR6 27IN ABS (SUTURE) IMPLANT
TOWEL OR 17X26 10 PK STRL BLUE (TOWEL DISPOSABLE) ×2 IMPLANT
TOWEL OR NON WOVEN STRL DISP B (DISPOSABLE) ×2 IMPLANT
TRAY LAPAROSCOPIC (CUSTOM PROCEDURE TRAY) ×2 IMPLANT
TROCAR 11X100 Z THREAD (TROCAR) IMPLANT
TROCAR BALLN 12MMX100 BLUNT (TROCAR) ×2 IMPLANT
TROCAR Z-THREAD FIOS 5X100MM (TROCAR) IMPLANT
TROCAR Z-THREAD OPTICAL 5X100M (TROCAR) ×2 IMPLANT

## 2022-12-24 NOTE — Op Note (Signed)
Carla Massey [id number] Jul 19, 1961 12/24/2022  Laparoscopic Cholecystectomy  Procedure Note  Indications: This patient presents with symptomatic gallbladder disease and will undergo laparoscopic cholecystectomy.  Pre-operative Diagnosis: symptomatic cholelithiasis  Post-operative Diagnosis: Same  Surgeon: Greer Pickerel MD FACS  Assistants: none  Anesthesia: General endotracheal anesthesia  Procedure Details  The patient was seen again in the Holding Room. The risks, benefits, complications, treatment options, and expected outcomes were discussed with the patient. The possibilities of reaction to medication, pulmonary aspiration, perforation of viscus, bleeding, recurrent infection, finding a normal gallbladder, the need for additional procedures, failure to diagnose a condition, the possible need to convert to an open procedure, and creating a complication requiring transfusion or operation were discussed with the patient. The likelihood of improving the patient's symptoms with return to their baseline status is good.  The patient and/or family concurred with the proposed plan, giving informed consent. The site of surgery properly noted. The patient was taken to Operating Room, identified as Carla Massey and the procedure verified as Laparoscopic Cholecystectomy . A Time Out was held and the above information confirmed. Antibiotic prophylaxis was administered.   Prior to the induction of general anesthesia, antibiotic prophylaxis was administered. General endotracheal anesthesia was then administered and tolerated well. After the induction, the abdomen was prepped with Chloraprep and draped in the sterile fashion. The patient was positioned in the supine position.  Local anesthetic agent was injected into the skin near the umbilicus and an incision made. We dissected down to the abdominal fascia with blunt dissection.  The fascia was incised vertically and we entered the peritoneal cavity bluntly.   A pursestring suture of 0-Vicryl was placed around the fascial opening.  The Hasson cannula was inserted and secured with the stay suture.  Pneumoperitoneum was then created with CO2 and tolerated well without any adverse changes in the patient's vital signs. An 5-mm port was placed in the subxiphoid position.  Two 5-mm ports were placed in the right upper quadrant. All skin incisions were infiltrated with a local anesthetic agent before making the incision and placing the trocars.   We positioned the patient in reverse Trendelenburg, tilted slightly to the patient's left.  The gallbladder was identified, the fundus grasped and retracted cephalad. Adhesions were lysed bluntly and with the electrocautery where indicated, taking care not to injure any adjacent organs or viscus. The infundibulum was grasped and retracted laterally, exposing the peritoneum overlying the triangle of Calot. This was then divided and exposed in a blunt fashion. A critical view of the cystic duct and cystic artery was obtained.  The cystic duct was clearly identified and bluntly dissected circumferentially.   The cystic duct was then ligated with clips and divided. The cystic artery (both small anterior & posterior branch) which had been identified & dissected free was ligated with clips and divided as well.   The gallbladder was dissected from the liver bed in retrograde fashion with the electrocautery. The gallbladder was removed and placed in an Ecco sac.  The gallbladder and Ecco sac were then removed through the umbilical port site. The liver bed was irrigated and inspected. Hemostasis was achieved with the electrocautery. Copious irrigation was utilized and was repeatedly aspirated until clear.  The pursestring suture was used to close the umbilical fascia.  She had some attenuated fascia at her umbilicus so I placed 2 additional interrupted 0 Vicryl's at the umbilicus with a PMI suture passer with laparoscopic guidance  We  again inspected the right upper  quadrant for hemostasis.  The umbilical closure was inspected and there was no air leak and nothing trapped within the closure. Pneumoperitoneum was released as we removed the trocars.  4-0 Monocryl was used to close the skin.    steri-strips, and clean dressings were applied. The patient was then extubated and brought to the recovery room in stable condition. Instrument, sponge, and needle counts were correct at closure and at the conclusion of the case.   Findings: Probable Cholecystitis with Cholelithiasis  Estimated Blood Loss: Minimal         Drains: none         Specimens: Gallbladder           Complications: None; patient tolerated the procedure well.         Disposition: PACU - hemodynamically stable.         Condition: stable  Leighton Ruff. Redmond Pulling, MD, FACS General, Bariatric, & Minimally Invasive Surgery [facility],  [place]

## 2022-12-24 NOTE — H&P (Signed)
CC: here for surgery  Requesting provider: n/a  HPI: Carla Massey is an 62 y.o. female who is here for lap chole with possible ioc.denies any changes since seen in clinic. Only nausea. No more episodes. No ed trips.    Old hpi: She saw GI in October complaining of nausea and epigastric discomfort. She reported that she had been having the symptoms for the past couple months and actually ended up in urgent care in August. She described it as a gnawing hungry type of sensation but would get nauseous when eating. She reported indigestion and feels like she needed to burp all the time to relieve the pressure. No vomiting. No NSAID or regular aspirin use. No regular alcohol.  She states that she is still having nausea almost on a daily basis. Her main complaint is nausea. But at times she can have some discomfort in the upper abdomen. At times the nausea will increase in intensity and will last for many hours. This will generally happen at night and by the morning it is much more improved. No pain radiating to the back or to the shoulder. When her symptoms initially started she tried Pepto, Tums, Gaviscon all without any significant change or improvement. She is taking Zofran as needed for breakthrough pain. Reflux meds have really not made a difference either. She is only lost about 3 pounds. Has a colonoscopy and upper endoscopy scheduled in January   Past Medical History:  Diagnosis Date   Allergic rhinitis    Anemia    Depression    history   Esophageal reflux    Headache    Hiatal hernia    Hypertension    Insomnia    Motility disorder, esophageal    Muscle weakness    Seasonal allergies    SVD (spontaneous vaginal delivery)    x 1    Past Surgical History:  Procedure Laterality Date   BILATERAL SALPINGECTOMY Bilateral 05/21/2015   Procedure: BILATERAL SALPINGECTOMY;  Surgeon: Vanessa Kick, MD;  Location: [facility];  Service: Gynecology;  Laterality: Bilateral;   COLONOSCOPY      LAPAROSCOPIC ASSISTED VAGINAL HYSTERECTOMY N/A 05/21/2015   Procedure: LAPAROSCOPIC ASSISTED VAGINAL HYSTERECTOMY;  Surgeon: Vanessa Kick, MD;  Location: [facility];  Service: Gynecology;  Laterality: N/A;   LUMBAR DISC SURGERY  06/11/2021   L4-L5   UPPER GI ENDOSCOPY     WISDOM TOOTH EXTRACTION      Family History  Problem Relation Age of Onset   Thyroid cancer Mother    Hypertension Mother    Breast cancer Mother 26   Hypertension Father    BRCA 1/2 Neg Hx     Social:  reports that she has never smoked. She has never used smokeless tobacco. She reports that she does not drink alcohol and does not use drugs.  Allergies:  Allergies  Allergen Reactions   Ketoconazole Hives    Nizoral for bacterial infection     Medications: I have reviewed the patient's current medications.   ROS - all of the below systems have been reviewed with the patient and positives are indicated with bold text General: chills, fever or night sweats Eyes: blurry vision or double vision ENT: epistaxis or sore throat Allergy/Immunology: itchy/watery eyes or nasal congestion Hematologic/Lymphatic: bleeding problems, blood clots or swollen lymph nodes Endocrine: temperature intolerance or unexpected weight changes Breast: new or changing breast lumps or nipple discharge Resp: cough, shortness of breath, or wheezing CV: chest pain or dyspnea on exertion GI: as  per HPI GU: dysuria, trouble voiding, or hematuria MSK: joint pain or joint stiffness Neuro: TIA or stroke symptoms Derm: pruritus and skin lesion changes Psych: anxiety and depression  PE Blood pressure 119/72, pulse 78, temperature 98.7 F (37.1 C), temperature source Oral, resp. rate 16, height '5\' 2"'$  (1.575 m), weight 64.4 kg, last menstrual period 05/15/2015, SpO2 99 %. Constitutional: NAD; conversant; no deformities Eyes: Moist conjunctiva; no lid lag; anicteric; PERRL Neck: Trachea midline; no thyromegaly Lungs: Normal respiratory effort; no  tactile fremitus CV: RRR; no palpable thrills; no pitting edema GI: Abd soft, nt; no palpable hepatosplenomegaly MSK: Normal gait; no clubbing/cyanosis Psychiatric: Appropriate affect; alert and oriented x3 Lymphatic: No palpable cervical or axillary lymphadenopathy Skin:no rash/lesions/jaundice  No results found for this or any previous visit (from the past 15 hour(s)).  No results found.  Imaging: reviewed  A/P: [name] is an 62 y.o. female with symptomatic cholelithiasis  To OR for lap chole w/poss ioc Eras protoco All questions asked and answered  Leighton Ruff. Redmond Pulling, MD, FACS General, Bariatric, & Minimally Invasive Surgery [facility]

## 2022-12-24 NOTE — Anesthesia Postprocedure Evaluation (Signed)
Anesthesia Post Note  Patient: Carla Massey  Procedure(s) Performed: LAPAROSCOPIC CHOLECYSTECTOMY     Patient location during evaluation: PACU Anesthesia Type: General Level of consciousness: awake and alert Pain management: pain level controlled Vital Signs Assessment: post-procedure vital signs reviewed and stable Respiratory status: spontaneous breathing, nonlabored ventilation, respiratory function stable and patient connected to nasal cannula oxygen Cardiovascular status: blood pressure returned to baseline and stable Postop Assessment: no apparent nausea or vomiting Anesthetic complications: no  No notable events documented.  Last Vitals:  Vitals:   12/24/22 1100 12/24/22 1143  BP: 128/71 128/76  Pulse: 74 76  Resp: 16 18  Temp: 36.7 C 36.7 C  SpO2: 99% 99%    Last Pain:  Vitals:   12/24/22 1143  TempSrc:   PainSc: 3                  Tiajuana Amass

## 2022-12-24 NOTE — Transfer of Care (Signed)
Immediate Anesthesia Transfer of Care Note  Patient: Carla Massey  Procedure(s) Performed: LAPAROSCOPIC CHOLECYSTECTOMY  Patient Location: PACU  Anesthesia Type:General  Level of Consciousness: awake, alert , oriented, and patient cooperative  Airway & Oxygen Therapy: Patient Spontanous Breathing and Patient connected to face mask oxygen  Post-op Assessment: Report given to RN and Post -op Vital signs reviewed and stable  Post vital signs: Reviewed and stable  Last Vitals:  Vitals Value Taken Time  BP 148/88 12/24/22 0917  Temp    Pulse 77 12/24/22 0919  Resp 16 12/24/22 0919  SpO2 100 % 12/24/22 0919  Vitals shown include unvalidated device data.  Last Pain:  Vitals:   12/24/22 0603  TempSrc: Oral  PainSc:       Patients Stated Pain Goal: 3 (47/65/46 5035)  Complications: No notable events documented.

## 2022-12-24 NOTE — Anesthesia Procedure Notes (Signed)
Procedure Name: Intubation Date/Time: 12/24/2022 7:45 AM  Performed by: Garrel Ridgel, CRNAPre-anesthesia Checklist: Patient identified, Emergency Drugs available, Suction available and Patient being monitored Patient Re-evaluated:Patient Re-evaluated prior to induction Oxygen Delivery Method: Circle system utilized Preoxygenation: Pre-oxygenation with 100% oxygen Induction Type: IV induction Ventilation: Mask ventilation without difficulty Laryngoscope Size: Mac and 3 Grade View: Grade II Tube type: Oral Number of attempts: 1 Airway Equipment and Method: Stylet Placement Confirmation: ETT inserted through vocal cords under direct vision, positive ETCO2 and breath sounds checked- equal and bilateral Secured at: 22 cm Tube secured with: Tape Dental Injury: Teeth and Oropharynx as per pre-operative assessment

## 2022-12-24 NOTE — Discharge Instructions (Signed)
[facility] LAPAROSCOPIC SURGERY: POST OP INSTRUCTIONS Always review your discharge instruction sheet given to you by the facility where your surgery was performed. IF YOU HAVE DISABILITY OR FAMILY LEAVE FORMS, YOU MUST BRING THEM TO THE OFFICE FOR PROCESSING.   DO NOT GIVE THEM TO YOUR DOCTOR.  PAIN CONTROL  First take acetaminophen (Tylenol) AND/or ibuprofen (Advil) to control your pain after surgery.  Follow directions on package.  Taking acetaminophen (Tylenol) and/or ibuprofen (Advil) regularly after surgery will help to control your pain and lower the amount of prescription pain medication you may need.  You should not take more than 3,000 mg (3 grams) of acetaminophen (Tylenol) in 24 hours.  You should not take ibuprofen (Advil), aleve, motrin, naprosyn or other NSAIDS if you have a history of stomach ulcers or chronic kidney disease.  A prescription for pain medication may be given to you upon discharge.  Take your pain medication as prescribed, if you still have uncontrolled pain after taking acetaminophen (Tylenol) or ibuprofen (Advil). Use ice packs to help control pain. If you need a refill on your pain medication, please contact your pharmacy.  They will contact our office to request authorization. Prescriptions will not be filled after 5pm or on week-ends.  HOME MEDICATIONS Take your usually prescribed medications unless otherwise directed.  DIET You should follow a light diet the first few days after arrival home.  Be sure to include lots of fluids daily. Avoid fatty, fried foods.   CONSTIPATION It is common to experience some constipation after surgery and if you are taking pain medication.  Increasing fluid intake and taking a stool softener (such as Colace) will usually help or prevent this problem from occurring.  A mild laxative (Milk of Magnesia or Miralax) should be taken according to package instructions if there are no bowel movements after 48  hours.  WOUND/INCISION CARE Most patients will experience some swelling and bruising in the area of the incisions.  Ice packs will help.  Swelling and bruising can take several days to resolve.  Unless discharge instructions indicate otherwise, follow guidelines below  STERI-STRIPS - you may remove your outer bandages 48 hours after surgery, and you may shower at that time.  You have steri-strips (small skin tapes) in place directly over the incision.  These strips should be left on the skin for 7-10 days.   DERMABOND/SKIN GLUE - you may shower in 24 hours.  The glue will flake off over the next 2-3 weeks. Any sutures or staples will be removed at the office during your follow-up visit.  ACTIVITIES You may resume regular (light) daily activities beginning the next day--such as daily self-care, walking, climbing stairs--gradually increasing activities as tolerated.  You may have sexual intercourse when it is comfortable.  Refrain from any heavy lifting or straining until approved by your doctor. You may drive when you are no longer taking prescription pain medication, you can comfortably wear a seatbelt, and you can safely maneuver your car and apply brakes.  FOLLOW-UP You should see your doctor in the office for a follow-up appointment approximately 2-3 weeks after your surgery.  You should have been given your post-op/follow-up appointment when your surgery was scheduled.  If you did not receive a post-op/follow-up appointment, make sure that you call for this appointment within a day or two after you arrive home to insure a convenient appointment time.  OTHER INSTRUCTIONS   WHEN TO CALL YOUR DOCTOR: Fever over 101.0 Inability to urinate Continued   bleeding from incision. Increased pain, redness, or drainage from the incision. Increasing abdominal pain  The clinic staff is available to answer your questions during regular business hours.  Please don't hesitate to call and ask to speak to  one of the nurses for clinical concerns.  If you have a medical emergency, go to the nearest emergency room or call 911.  A surgeon from [facility] is always on call at the hospital. [address], [address], [place], [place]  [address] ? [address] [address], , [place]   [address] [phone] ? [phone] ? FAX [phone] Web site: [email or web address]  

## 2022-12-25 ENCOUNTER — Encounter (HOSPITAL_COMMUNITY): Payer: Self-pay | Admitting: General Surgery

## 2022-12-25 LAB — SURGICAL PATHOLOGY

## 2023-01-22 ENCOUNTER — Encounter: Payer: Self-pay | Admitting: Internal Medicine

## 2023-02-22 ENCOUNTER — Other Ambulatory Visit: Payer: Self-pay | Admitting: Obstetrics and Gynecology

## 2023-02-22 DIAGNOSIS — Z1231 Encounter for screening mammogram for malignant neoplasm of breast: Secondary | ICD-10-CM

## 2023-03-26 ENCOUNTER — Ambulatory Visit (AMBULATORY_SURGERY_CENTER): Payer: Managed Care, Other (non HMO) | Admitting: *Deleted

## 2023-03-26 VITALS — Ht 62.0 in | Wt 144.0 lb

## 2023-03-26 DIAGNOSIS — R1013 Epigastric pain: Secondary | ICD-10-CM

## 2023-03-26 DIAGNOSIS — Z1211 Encounter for screening for malignant neoplasm of colon: Secondary | ICD-10-CM

## 2023-03-26 DIAGNOSIS — R112 Nausea with vomiting, unspecified: Secondary | ICD-10-CM

## 2023-03-26 DIAGNOSIS — K219 Gastro-esophageal reflux disease without esophagitis: Secondary | ICD-10-CM

## 2023-03-26 MED ORDER — NA SULFATE-K SULFATE-MG SULF 17.5-3.13-1.6 GM/177ML PO SOLN: 1.0000 | Freq: Once | ORAL | 0 refills | Status: AC

## 2023-03-26 NOTE — Progress Notes (Signed)
No egg or soy allergy known to patient  No issues known to pt with past sedation with any surgeries or procedures Patient denies ever being told they had issues or difficulty with intubation  No FH of Malignant Hyperthermia Pt is not on diet pills Pt is not on  home 02  Pt is not on blood thinners  Pt HAS issues with constipation,EXTRA MIRALAX No A fib or A flutter Have any cardiac testing pending--NO Pt instructed to use Singlecare.com or GoodRx for a price reduction on prep    Patient's chart reviewed by Cathlyn Parsons CNRA prior to previsit and patient appropriate for the [facility].  Previsit completed and red dot placed by patient's name on their procedure day (on provider's schedule).

## 2023-03-31 ENCOUNTER — Encounter: Payer: Self-pay | Admitting: Internal Medicine

## 2023-04-09 ENCOUNTER — Ambulatory Visit: Payer: Managed Care, Other (non HMO) | Admitting: Internal Medicine

## 2023-04-09 ENCOUNTER — Encounter: Payer: Self-pay | Admitting: Internal Medicine

## 2023-04-09 VITALS — BP 130/88 | HR 69 | Temp 98.4°F | Resp 10 | Ht 62.5 in | Wt 144.0 lb

## 2023-04-09 DIAGNOSIS — K219 Gastro-esophageal reflux disease without esophagitis: Secondary | ICD-10-CM

## 2023-04-09 DIAGNOSIS — Z1211 Encounter for screening for malignant neoplasm of colon: Secondary | ICD-10-CM

## 2023-04-09 DIAGNOSIS — K222 Esophageal obstruction: Secondary | ICD-10-CM

## 2023-04-09 DIAGNOSIS — R112 Nausea with vomiting, unspecified: Secondary | ICD-10-CM

## 2023-04-09 DIAGNOSIS — R1013 Epigastric pain: Secondary | ICD-10-CM

## 2023-04-09 DIAGNOSIS — K317 Polyp of stomach and duodenum: Secondary | ICD-10-CM

## 2023-04-09 MED ORDER — SODIUM CHLORIDE 0.9 % IV SOLN: 500.0000 mL | Freq: Once | INTRAVENOUS | Status: DC

## 2023-04-09 NOTE — Progress Notes (Signed)
Called to room to assist during endoscopic procedure.  Patient ID and intended procedure confirmed with present staff. Received instructions for my participation in the procedure from the performing physician.  

## 2023-04-09 NOTE — Progress Notes (Signed)
[facility] History and Physical   Primary Care Physician:  Assunta Found, MD   Reason for Procedure:   Dyspepsia, ? Reflux; colon cancer screening  Plan:    EGD and colonoscopy     HPI: Carla Massey is a 62 y.o. female seen 09/2022 w/ above c/o - still having sxs. US showed gallstones and steatosis, 10/23. Had lap chole 1/24.Still w/ daily nausea and also some globus +++stress  Also chronic constipation   Past Medical History:  Diagnosis Date   Allergic rhinitis    Allergy    SEASONAL   Anemia    Anxiety    Arthritis    BACK,SHOULDERS   Depression    history   Esophageal reflux    Headache    Hiatal hernia    Hypertension    Insomnia    Motility disorder, esophageal    Muscle weakness    Seasonal allergies    SVD (spontaneous vaginal delivery)    x 1    Past Surgical History:  Procedure Laterality Date   BILATERAL SALPINGECTOMY Bilateral 05/21/2015   Procedure: BILATERAL SALPINGECTOMY;  Surgeon: Waynard Reeds, MD;  Location: [facility];  Service: Gynecology;  Laterality: Bilateral;   CHOLECYSTECTOMY N/A 12/24/2022   Procedure: LAPAROSCOPIC CHOLECYSTECTOMY;  Surgeon: Gaynelle Adu, MD;  Location: [facility];  Service: General;  Laterality: N/A;   COLONOSCOPY     LAPAROSCOPIC ASSISTED VAGINAL HYSTERECTOMY N/A 05/21/2015   Procedure: LAPAROSCOPIC ASSISTED VAGINAL HYSTERECTOMY;  Surgeon: Waynard Reeds, MD;  Location: [facility];  Service: Gynecology;  Laterality: N/A;   LUMBAR DISC SURGERY  06/11/2021   L4-L5   UPPER GASTROINTESTINAL ENDOSCOPY     UPPER GI ENDOSCOPY     WISDOM TOOTH EXTRACTION      Prior to Admission medications   Medication Sig Start Date End Date Taking? Authorizing Provider  losartan-hydrochlorothiazide (HYZAAR) 100-25 MG tablet Take 1 tablet by mouth daily. 01/09/21  Yes [provider]  ondansetron (ZOFRAN) 4 MG tablet Take 1 tablet (4 mg total) by mouth every 8 (eight) hours as needed for nausea or vomiting. 11/10/22  Yes Esterwood,  Amy S, PA-C  pantoprazole (PROTONIX) 40 MG tablet Take 1 tablet (40 mg total) by mouth daily before breakfast. 09/25/22  Yes Esterwood, Amy S, PA-C  zolpidem (AMBIEN) 10 MG tablet Take 5 mg by mouth at bedtime as needed for sleep.   Yes [provider]  Calcium Carb-Cholecalciferol (CALCIUM + D3 PO) Take by mouth. TAKE 2 DAILY    [provider]  cholecalciferol (VITAMIN D3) 25 MCG (1000 UNIT) tablet Take 1,000 Units by mouth daily.    [provider]  MAGNESIUM PO Take 1 tablet by mouth daily.    [provider]  oxyCODONE (OXY IR/ROXICODONE) 5 MG immediate release tablet Take 1 tablet (5 mg total) by mouth every 6 (six) hours as needed for severe pain. 12/24/22   Gaynelle Adu, MD  POTASSIUM PO Take 99 mg by mouth daily.    [provider]  promethazine (PHENERGAN) 12.5 MG tablet Take 12.5 mg by mouth every 8 (eight) hours as needed. 12/18/22   [provider]    Current Outpatient Medications  Medication Sig Dispense Refill   losartan-hydrochlorothiazide (HYZAAR) 100-25 MG tablet Take 1 tablet by mouth daily.     ondansetron (ZOFRAN) 4 MG tablet Take 1 tablet (4 mg total) by mouth every 8 (eight) hours as needed for nausea or vomiting. 50 tablet 0   pantoprazole (PROTONIX) 40 MG tablet Take 1  tablet (40 mg total) by mouth daily before breakfast. 30 tablet 11   zolpidem (AMBIEN) 10 MG tablet Take 5 mg by mouth at bedtime as needed for sleep.     Calcium Carb-Cholecalciferol (CALCIUM + D3 PO) Take by mouth. TAKE 2 DAILY     cholecalciferol (VITAMIN D3) 25 MCG (1000 UNIT) tablet Take 1,000 Units by mouth daily.     MAGNESIUM PO Take 1 tablet by mouth daily.     oxyCODONE (OXY IR/ROXICODONE) 5 MG immediate release tablet Take 1 tablet (5 mg total) by mouth every 6 (six) hours as needed for severe pain. 15 tablet 0   POTASSIUM PO Take 99 mg by mouth daily.     promethazine (PHENERGAN) 12.5 MG tablet Take 12.5 mg by mouth every 8 (eight) hours as  needed.     Current Facility-Administered Medications  Medication Dose Route Frequency Provider Last Rate Last Admin   0.9 %  sodium chloride infusion  500 mL Intravenous Once Iva Boop, MD        Allergies as of 04/09/2023 - Review Complete 04/09/2023  Allergen Reaction Noted   Ketoconazole Hives 05/06/2015    Family History  Problem Relation Age of Onset   Colon polyps Mother    Thyroid cancer Mother    Hypertension Mother    Breast cancer Mother 57   Hypertension Father    Colon polyps Brother    BRCA 1/2 Neg Hx    Colon cancer Neg Hx    Crohn's disease Neg Hx    Esophageal cancer Neg Hx    Rectal cancer Neg Hx    Stomach cancer Neg Hx    Ulcerative colitis Neg Hx     Social History   Socioeconomic History   Marital status: Married    Spouse name: Not on file   Number of children: 1   Years of education: HS   Highest education level: Not on file  Occupational History   Occupation: Surveyor, minerals: [employer]    Comment: Dental Practice  Tobacco Use   Smoking status: Never   Smokeless tobacco: Never  Vaping Use   Vaping Use: Never used  Substance and Sexual Activity   Alcohol use: No    Alcohol/week: 0.0 standard drinks of alcohol   Drug use: No   Sexual activity: Yes  Other Topics Concern   Not on file  Social History Narrative   Lives at home with husband.   Right-handed.   16oz caffeine daily.   Social Determinants of Health   Financial Resource Strain: Not on file  Food Insecurity: Not on file  Transportation Needs: Not on file  Physical Activity: Not on file  Stress: Not on file  Social Connections: Not on file  Intimate Partner Violence: Not on file    Review of Systems:  All other review of systems negative except as mentioned in the HPI.  Physical Exam: Vital signs BP 137/84   Pulse 74   Temp 98.4 F (36.9 C) (Temporal)   Resp 16   Ht 5' 2.5" (1.588 m)   Wt 144 lb (65.3 kg)   LMP 05/15/2015 Comment:  bleeding  SpO2 98%   BMI 25.92 kg/m   General:   Alert,  Well-developed, well-nourished, pleasant and cooperative in NAD Lungs:  Clear throughout to auscultation.   Heart:  Regular rate and rhythm; no murmurs, clicks, rubs,  or gallops. Abdomen:  Soft, nontender and nondistended. Normal bowel sounds.  Neuro/Psych:  Alert and cooperative. Normal mood and affect. A and O x 3   @Iniya Matzek  Sena Slate, MD, [facility] [phone] (pager) 04/09/2023 8:32 AM@

## 2023-04-09 NOTE — Op Note (Addendum)
[facility] Patient Name: Carla Massey Procedure Date: 04/09/2023 8:30 AM MRN: [id number] Endoscopist: Iva Boop , MD, [id number] Age: 62 Referring MD:  Date of Birth: 02/18/1961 Gender: Female Account #: [id number] Procedure:                Colonoscopy Indications:              Screening for colorectal malignant neoplasm, Last                            colonoscopy: 2014 Medicines:                Monitored Anesthesia Care Procedure:                Pre-Anesthesia Assessment:                           - Prior to the procedure, a History and Physical                            was performed, and patient medications and                            allergies were reviewed. The patient's tolerance of                            previous anesthesia was also reviewed. The risks                            and benefits of the procedure and the sedation                            options and risks were discussed with the patient.                            All questions were answered, and informed consent                            was obtained. Prior Anticoagulants: The patient has                            taken no anticoagulant or antiplatelet agents. ASA                            Grade Assessment: II - A patient with mild systemic                            disease. After reviewing the risks and benefits,                            the patient was deemed in satisfactory condition to                            undergo the procedure.  After obtaining informed consent, the colonoscope                            was passed under direct vision. Throughout the                            procedure, the patient's blood pressure, pulse, and                            oxygen saturations were monitored continuously. The                            Olympus PCF-[id number] (#[id number]) Colonoscope was                            introduced through the anus and advanced to the  the                            cecum, identified by appendiceal orifice and                            ileocecal valve. The CF [id number] #[id number] was                            introduced through the anus and advanced to the the                            cecum, identified by appendiceal orifice and                            ileocecal valve. The colonoscopy was performed                            without difficulty. The patient tolerated the                            procedure well. The quality of the bowel                            preparation was excellent. The ileocecal valve,                            appendiceal orifice, and rectum were photographed.                            The bowel preparation used was SUPREP via split                            dose instruction. Scope In: 8:53:08 AM Scope Out: 9:05:32 AM Scope Withdrawal Time: 0 hours 7 minutes 58 seconds  Total Procedure Duration: 0 hours 12 minutes 24 seconds  Findings:                 The perianal and digital rectal examinations were  normal.                           The colon (entire examined portion) was mildly                            redundant. Advancing the scope required abdominal                            binder applied pre-procedure due to history.                           The exam was otherwise without abnormality on                            direct and retroflexion views. Complications:            No immediate complications. Estimated Blood Loss:     Estimated blood loss: none. Impression:               - Redundant colon. Abdominal binder used because of                            2014 procedure descrinbing redundant colon.                           - The examination was otherwise normal on direct                            and retroflexion views.                           - No specimens collected. Recommendation:           - Patient has a contact number available for                             emergencies. The signs and symptoms of potential                            delayed complications were discussed with the                            patient. Return to normal activities tomorrow.                            Written discharge instructions were provided to the                            patient.                           - Resume previous diet.                           - Continue present medications.                           -  See the other procedure note for documentation of                            additional recommendations.                           - Repeat colonoscopy in 10 years for screening                            purposes. Iva Boop, MD 04/09/2023 9:19:29 AM This report has been signed electronically.

## 2023-04-09 NOTE — Patient Instructions (Addendum)
There were some tiny stomach polyps seen and biopsied, there was some possible gastritis seen and biopsied and a ring or scarred area where esophagus and stomach meet that was biopsied. Also saw a hiatal hernia - please read about it. Nothing dangerous. These abnormalities may contribute to symptoms though not sure. There can be physical symptoms from stress, so that is possible also.  Colonoscopy was normal.  I will contact with results and recommendations. I appreciate the opportunity to care for you. Iva Boop, MD, [facility]  Resume previous diet Continue present medications Await pathology results Information given on Hiatal hernia, stomach polyps, Schatzki ring  There were no colon polyps seen today!  You will need another screening colonoscopy in 10 years, you will receive a letter at that time when you are due for the procedure.    Please call us at [phone] if you have a change in bowel habits, change in family history of colo-rectal cancer, rectal bleeding or other GI concern before that time.  Handouts/information given for polyps, diverticulosis and hemorrhoids  YOU HAD AN ENDOSCOPIC PROCEDURE TODAY AT THE [facility]:   Refer to the procedure report that was given to you for any specific questions about what was found during the examination.  If the procedure report does not answer your questions, please call your gastroenterologist to clarify.  If you requested that your care partner not be given the details of your procedure findings, then the procedure report has been included in a sealed envelope for you to review at your convenience later.  YOU SHOULD EXPECT: Some feelings of bloating in the abdomen. Passage of more gas than usual.  Walking can help get rid of the air that was put into your GI tract during the procedure and reduce the bloating. If you had a lower endoscopy (such as a colonoscopy or flexible sigmoidoscopy) you may notice spotting of blood in  your stool or on the toilet paper. If you underwent a bowel prep for your procedure, you may not have a normal bowel movement for a few days.  Please Note:  You might notice some irritation and congestion in your nose or some drainage.  This is from the oxygen used during your procedure.  There is no need for concern and it should clear up in a day or so.  SYMPTOMS TO REPORT IMMEDIATELY: Following lower endoscopy (colonoscopy):  Excessive amounts of blood in the stool  Significant tenderness or worsening of abdominal pains  Swelling of the abdomen that is new, acute  Fever of 100F or higher Following upper endoscopy (EGD)  Vomiting of blood or coffee ground material  New chest pain or pain under the shoulder blades  Painful or persistently difficult swallowing  New shortness of breath  Black, tarry-looking stools For urgent or emergent issues, a gastroenterologist can be reached at any hour by calling [phone]. Do not use MyChart messaging for urgent concerns.    DIET:  We do recommend a small meal at first, but then you may proceed to your regular diet.  Drink plenty of fluids but you should avoid alcoholic beverages for 24 hours.  ACTIVITY:  You should plan to take it easy for the rest of today and you should NOT DRIVE or use heavy machinery until tomorrow (because of the sedation medicines used during the test).    FOLLOW UP: Our staff will call the number listed on your records the next business day following your procedure.  We will  call around 7:15- 8:00 am to check on you and address any questions or concerns that you may have regarding the information given to you following your procedure. If we do not reach you, we will leave a message.     If any biopsies were taken you will be contacted by phone or by letter within the next 1-3 weeks.  Please call us at [phone] if you have not heard about the biopsies in 3 weeks.    SIGNATURES/CONFIDENTIALITY: You and/or your  care partner have signed paperwork which will be entered into your electronic medical record.  These signatures attest to the fact that that the information above on your After Visit Summary has been reviewed and is understood.  Full responsibility of the confidentiality of this discharge information lies with you and/or your care-partner.

## 2023-04-09 NOTE — Op Note (Signed)
[facility] Patient Name: Carla Massey Procedure Date: 04/09/2023 8:31 AM MRN: [id number] Endoscopist: Iva Boop , MD, [id number] Age: 62 Referring MD:  Date of Birth: [birth year] Gender: Female Account #: [id number] Procedure:                Upper GI endoscopy Indications:              Dyspepsia, Globus sensation, Nausea Medicines:                Monitored Anesthesia Care Procedure:                Pre-Anesthesia Assessment:                           - Prior to the procedure, a History and Physical                            was performed, and patient medications and                            allergies were reviewed. The patient's tolerance of                            previous anesthesia was also reviewed. The risks                            and benefits of the procedure and the sedation                            options and risks were discussed with the patient.                            All questions were answered, and informed consent                            was obtained. Prior Anticoagulants: The patient has                            taken no anticoagulant or antiplatelet agents. ASA                            Grade Assessment: II - A patient with mild systemic                            disease. After reviewing the risks and benefits,                            the patient was deemed in satisfactory condition to                            undergo the procedure.                           After obtaining informed consent, the endoscope was  passed under direct vision. Throughout the                            procedure, the patient's blood pressure, pulse, and                            oxygen saturations were monitored continuously. The                            Olympus Scope [id number] was introduced through the                            mouth, and advanced to the second part of duodenum.                            The upper GI  endoscopy was accomplished without                            difficulty. The patient tolerated the procedure                            well. Scope In: Scope Out: Findings:                 A non-obstructing Schatzki ring was found at the                            gastroesophageal junction. Biopsies were taken with                            a cold forceps for histology. Verification of                            patient identification for the specimen was done.                            Estimated blood loss was minimal.                           A 4 cm hiatal hernia was present.                           The gastroesophageal flap valve was visualized                            endoscopically and classified as Hill Grade IV (no                            fold, wide open lumen, hiatal hernia present).                           Multiple diminutive sessile polyps were found in                            the gastric body. Biopsies were  taken with a cold                            forceps for histology. Verification of patient                            identification for the specimen was done. Estimated                            blood loss was minimal.                           Patchy mild mucosal changes characterized by                            congestion, erythema and an increased vascular                            pattern were found in the gastric antrum. Biopsies                            were taken with a cold forceps for histology.                            Verification of patient identification for the                            specimen was done. Estimated blood loss was minimal.                           The exam was otherwise without abnormality.                           The cardia and gastric fundus were normal on                            retroflexion. Complications:            No immediate complications. Estimated Blood Loss:     Estimated blood loss was  minimal. Impression:               - Non-obstructing Schatzki ring. Biopsied.                           - 4 cm hiatal hernia.                           - Gastroesophageal flap valve classified as Hill                            Grade IV (no fold, wide open lumen, hiatal hernia                            present).                           -  Multiple gastric polyps. Biopsied.                           - Congested, erythematous and increased vascular                            pattern mucosa in the antrum. Biopsied.                           - The examination was otherwise normal. Recommendation:           - Patient has a contact number available for                            emergencies. The signs and symptoms of potential                            delayed complications were discussed with the                            patient. Return to normal activities tomorrow.                            Written discharge instructions were provided to the                            patient.                           - Resume previous diet.                           - Continue present medications.                           - Await pathology results.                           - See the other procedure note for documentation of                            additional recommendations. Iva Boop, MD 04/09/2023 9:15:50 AM This report has been signed electronically.

## 2023-04-09 NOTE — Progress Notes (Signed)
VS completed by CW   Pt's states no medical or surgical changes since previsit or office visit.  

## 2023-04-09 NOTE — Progress Notes (Signed)
Report to PACU, RN, vss, BBS= Clear.  

## 2023-04-12 ENCOUNTER — Telehealth: Payer: Self-pay | Admitting: *Deleted

## 2023-04-12 NOTE — Telephone Encounter (Signed)
  Follow up Call-     04/09/2023    7:35 AM  Call back number  Post procedure Call Back phone  # [phone]  Permission to leave phone message Yes     Patient questions:  Do you have a fever, pain , or abdominal swelling? No. Pain Score  0 *  Have you tolerated food without any problems? Yes.    Have you been able to return to your normal activities? Yes.    Do you have any questions about your discharge instructions: Diet   No. Medications  No. Follow up visit  No.  Do you have questions or concerns about your Care? No.  Actions: * If pain score is 4 or above: No action needed, pain <4.

## 2023-04-16 ENCOUNTER — Other Ambulatory Visit: Payer: Self-pay

## 2023-04-16 ENCOUNTER — Emergency Department (HOSPITAL_COMMUNITY)
Admission: EM | Admit: 2023-04-16 | Discharge: 2023-04-16 | Disposition: A | Discharge status: Home / Self Care | Payer: Managed Care, Other (non HMO) | Attending: Emergency Medicine | Admitting: Emergency Medicine

## 2023-04-16 ENCOUNTER — Emergency Department (HOSPITAL_COMMUNITY): Payer: Managed Care, Other (non HMO)

## 2023-04-16 ENCOUNTER — Ambulatory Visit
Admission: RE | Admit: 2023-04-16 | Discharge: 2023-04-16 | Disposition: A | Discharge status: Home / Self Care | Payer: Managed Care, Other (non HMO) | Source: Ambulatory Visit | Attending: Obstetrics and Gynecology | Admitting: Obstetrics and Gynecology

## 2023-04-16 DIAGNOSIS — Z1231 Encounter for screening mammogram for malignant neoplasm of breast: Secondary | ICD-10-CM

## 2023-04-16 DIAGNOSIS — Z79899 Other long term (current) drug therapy: Secondary | ICD-10-CM | POA: Insufficient documentation

## 2023-04-16 DIAGNOSIS — N2889 Other specified disorders of kidney and ureter: Secondary | ICD-10-CM | POA: Diagnosis not present

## 2023-04-16 DIAGNOSIS — E876 Hypokalemia: Secondary | ICD-10-CM | POA: Diagnosis not present

## 2023-04-16 DIAGNOSIS — R55 Syncope and collapse: Secondary | ICD-10-CM

## 2023-04-16 DIAGNOSIS — I10 Essential (primary) hypertension: Secondary | ICD-10-CM | POA: Diagnosis not present

## 2023-04-16 LAB — CBC WITH DIFFERENTIAL/PLATELET
Abs Immature Granulocytes: 0.04 10*3/uL (ref 0.00–0.07)
Basophils Absolute: 0 10*3/uL (ref 0.0–0.1)
Basophils Relative: 0 %
Eosinophils Absolute: 0.1 10*3/uL (ref 0.0–0.5)
Eosinophils Relative: 2 %
HCT: 34.5 % — ABNORMAL LOW (ref 36.0–46.0)
Hemoglobin: 11.6 g/dL — ABNORMAL LOW (ref 12.0–15.0)
Immature Granulocytes: 1 %
Lymphocytes Relative: 18 %
Lymphs Abs: 1.1 10*3/uL (ref 0.7–4.0)
MCH: 28.9 pg (ref 26.0–34.0)
MCHC: 33.6 g/dL (ref 30.0–36.0)
MCV: 85.8 fL (ref 80.0–100.0)
Monocytes Absolute: 0.3 10*3/uL (ref 0.1–1.0)
Monocytes Relative: 5 %
Neutro Abs: 4.6 10*3/uL (ref 1.7–7.7)
Neutrophils Relative %: 74 %
Platelets: 250 10*3/uL (ref 150–400)
RBC: 4.02 MIL/uL (ref 3.87–5.11)
RDW: 12.4 % (ref 11.5–15.5)
WBC: 6.2 10*3/uL (ref 4.0–10.5)
nRBC: 0 % (ref 0.0–0.2)

## 2023-04-16 LAB — COMPREHENSIVE METABOLIC PANEL
ALT: 21 U/L (ref 0–44)
AST: 24 U/L (ref 15–41)
Albumin: 3.5 g/dL (ref 3.5–5.0)
Alkaline Phosphatase: 64 U/L (ref 38–126)
Anion gap: 11 (ref 5–15)
BUN: 7 mg/dL (ref 6–20)
CO2: 27 mmol/L (ref 22–32)
Calcium: 8.7 mg/dL — ABNORMAL LOW (ref 8.9–10.3)
Chloride: 94 mmol/L — ABNORMAL LOW (ref 98–111)
Creatinine, Ser: 0.62 mg/dL (ref 0.44–1.00)
GFR, Estimated: >60 mL/min (ref >60)
Glucose, Bld: 107 mg/dL — ABNORMAL HIGH (ref 70–99)
Potassium: 3.2 mmol/L — ABNORMAL LOW (ref 3.5–5.1)
Sodium: 132 mmol/L — ABNORMAL LOW (ref 135–145)
Total Bilirubin: 0.4 mg/dL (ref 0.3–1.2)
Total Protein: 6 g/dL — ABNORMAL LOW (ref 6.5–8.1)

## 2023-04-16 LAB — URINALYSIS, ROUTINE W REFLEX MICROSCOPIC
Bilirubin Urine: NEGATIVE
Glucose, UA: NEGATIVE mg/dL
Hgb urine dipstick: NEGATIVE
Ketones, ur: NEGATIVE mg/dL
Leukocytes,Ua: NEGATIVE
Nitrite: NEGATIVE
Protein, ur: NEGATIVE mg/dL
Specific Gravity, Urine: 1.006 (ref 1.005–1.030)
pH: 7 (ref 5.0–8.0)

## 2023-04-16 LAB — TROPONIN I (HIGH SENSITIVITY)
Troponin I (High Sensitivity): 3 ng/L (ref <18)
Troponin I (High Sensitivity): 2 ng/L (ref <18)

## 2023-04-16 LAB — MAGNESIUM: Magnesium: 1.6 mg/dL — ABNORMAL LOW (ref 1.7–2.4)

## 2023-04-16 LAB — LIPASE, BLOOD: Lipase: 34 U/L (ref 11–51)

## 2023-04-16 LAB — D-DIMER, QUANTITATIVE: D-Dimer, Quant: 0.65 ug/mL-FEU — ABNORMAL HIGH (ref 0.00–0.50)

## 2023-04-16 MED ORDER — MAGNESIUM SULFATE 2 GM/50ML IV SOLN
2.0000 g | Freq: Once | INTRAVENOUS | Status: AC
  Administered 2023-04-16: 2 g via INTRAVENOUS
  Filled 2023-04-16: qty 50

## 2023-04-16 MED ORDER — LACTATED RINGERS IV BOLUS
1000.0000 mL | Freq: Once | INTRAVENOUS | Status: AC
  Administered 2023-04-16: 1000 mL via INTRAVENOUS

## 2023-04-16 MED ORDER — AMOXICILLIN-POT CLAVULANATE 875-125 MG PO TABS: 1.0000 | ORAL_TABLET | Freq: Two times a day (BID) | ORAL | 0 refills | Status: DC

## 2023-04-16 MED ORDER — IOHEXOL 350 MG/ML SOLN
65.0000 mL | Freq: Once | INTRAVENOUS | Status: AC | PRN
  Administered 2023-04-16: 65 mL via INTRAVENOUS

## 2023-04-16 MED ORDER — AMOXICILLIN-POT CLAVULANATE 875-125 MG PO TABS
1.0000 | ORAL_TABLET | Freq: Once | ORAL | Status: AC
  Administered 2023-04-16: 1 via ORAL
  Filled 2023-04-16: qty 1

## 2023-04-16 MED ORDER — POTASSIUM CHLORIDE CRYS ER 20 MEQ PO TBCR
40.0000 meq | EXTENDED_RELEASE_TABLET | Freq: Once | ORAL | Status: AC
  Administered 2023-04-16: 40 meq via ORAL
  Filled 2023-04-16: qty 2

## 2023-04-16 MED ORDER — POTASSIUM CHLORIDE CRYS ER 20 MEQ PO TBCR: 20.0000 meq | EXTENDED_RELEASE_TABLET | Freq: Two times a day (BID) | ORAL | 0 refills | Status: DC

## 2023-04-16 NOTE — ED Provider Notes (Signed)
[place] EMERGENCY DEPARTMENT AT [facility] Provider Note   CSN: [id number] Arrival date & time: 04/16/23  1005     History  Chief Complaint  Patient presents with   Dizziness   Near Syncope    Pt coming from Dr's office via EMS with dizziness and syncope. She was assisted to the floor, no fall, no trauma.     Carla Massey is a 62 y.o. female.  Patient with a history of nausea, hiatal hernia, esophageal reflux, hypertension presenting with near syncopal episode.  States she was going to her mammogram this morning when she had a near syncopal episode in the waiting room.  She felt well prior to this.  She ate crackers for breakfast that she normally does.  While sitting in the waiting room she began to feel lightheaded, had blurry vision, nausea, sweating.  She was lowered to the ground by staff and not hit her head.  Remembers waking up on the ground.  No tongue biting or incontinence.  No chest pain or shortness of breath.  Did have brief tingling to both hands after this episode which is now resolved.  Does improve now.  Blood sugar at the time was 129 per EMS. No room spinning dizziness.  Feeling back to baseline.  No recent vomiting or diarrhea.  Had a colonoscopy and EGD last week with only 1 normal bowel movement since.  No recent black or bloody stools.  Abdominal pain.  Had brief episode of left-sided rib pain today that is now resolved. She attributes this to "gas pain". Did have 1 syncopal episode several years ago which she was not evaluated for.  Denies any heart rhythm issues.  Denies any recent change to her p.o. intake or medications.  No known fever.  She notes over the past several weeks she has had "tingling" to the left side of her scalp that comes and goes lasting for several minutes at a time.  Not associated with any headache, weakness, numbness or tingling.  The history is provided by the patient and the EMS personnel.  Dizziness Associated symptoms:  nausea and weakness   Associated symptoms: no chest pain, no headaches, no shortness of breath and no vomiting   Near Syncope Pertinent negatives include no chest pain, no abdominal pain, no headaches and no shortness of breath.       Home Medications Prior to Admission medications   Medication Sig Start Date End Date Taking? Authorizing Provider  Calcium Carb-Cholecalciferol (CALCIUM + D3 PO) Take by mouth. TAKE 2 DAILY    [provider]  Estradiol 10 MCG TABS vaginal tablet Place 10 mcg vaginally 2 (two) times a week. At bedtime    [provider]  losartan-hydrochlorothiazide (HYZAAR) 100-25 MG tablet Take 1 tablet by mouth daily. 01/09/21   [provider]  MAGNESIUM PO Take 1 tablet by mouth daily.    [provider]  ondansetron (ZOFRAN) 4 MG tablet Take 1 tablet (4 mg total) by mouth every 8 (eight) hours as needed for nausea or vomiting. 11/10/22   Esterwood, Amy S, PA-C  pantoprazole (PROTONIX) 40 MG tablet Take 1 tablet (40 mg total) by mouth daily before breakfast. 09/25/22   Esterwood, Amy S, PA-C  POTASSIUM PO Take 99 mg by mouth daily.    [provider]  promethazine (PHENERGAN) 12.5 MG tablet Take 12.5 mg by mouth every 8 (eight) hours as needed. 12/18/22   [provider]  zolpidem (AMBIEN) 10 MG tablet  Take 5 mg by mouth at bedtime as needed for sleep.    [provider]      Allergies    Ketoconazole    Review of Systems   Review of Systems  Constitutional:  Positive for fatigue. Negative for activity change, appetite change and fever.  HENT:  Negative for congestion and rhinorrhea.   Respiratory:  Negative for cough, chest tightness and shortness of breath.   Cardiovascular:  Positive for near-syncope. Negative for chest pain.  Gastrointestinal:  Positive for nausea. Negative for abdominal pain and vomiting.  Genitourinary:  Negative for dysuria and hematuria.  Musculoskeletal:  Negative for arthralgias  and myalgias.  Skin:  Negative for rash.  Neurological:  Positive for dizziness, syncope, weakness and light-headedness. Negative for headaches.   all other systems are negative except as noted in the HPI and PMH.    Physical Exam Updated Vital Signs BP (!) 125/46   Pulse (!) 56   Temp (!) 97.5 F (36.4 C) (Oral)   Resp 16   Ht 5\' 2"  (1.575 m)   Wt 63.5 kg   LMP 05/15/2015 Comment: bleeding  SpO2 100%   BMI 25.61 kg/m  Physical Exam Vitals and nursing note reviewed.  Constitutional:      General: She is not in acute distress.    Appearance: She is well-developed.  HENT:     Head: Normocephalic and atraumatic.     Mouth/Throat:     Pharynx: No oropharyngeal exudate.  Eyes:     Conjunctiva/sclera: Conjunctivae normal.     Pupils: Pupils are equal, round, and reactive to light.  Neck:     Comments: No meningismus. Cardiovascular:     Rate and Rhythm: Normal rate and regular rhythm.     Heart sounds: Normal heart sounds. No murmur heard. Pulmonary:     Effort: Pulmonary effort is normal. No respiratory distress.     Breath sounds: Normal breath sounds.  Abdominal:     Palpations: Abdomen is soft.     Tenderness: There is no abdominal tenderness. There is no guarding or rebound.  Musculoskeletal:        General: No tenderness. Normal range of motion.     Cervical back: Normal range of motion and neck supple.  Skin:    General: Skin is warm.  Neurological:     Mental Status: She is alert and oriented to person, place, and time.     Cranial Nerves: No cranial nerve deficit.     Motor: No abnormal muscle tone.     Coordination: Coordination normal.     Comments:  5/5 strength throughout. CN 2-12 intact.Equal grip strength.   Psychiatric:        Behavior: Behavior normal.     ED Results / Procedures / Treatments   Labs (all labs ordered are listed, but only abnormal results are displayed) Labs Reviewed  CBC WITH DIFFERENTIAL/PLATELET - Abnormal; Notable for the  following components:      Result Value   Hemoglobin 11.6 (*)    HCT 34.5 (*)    All other components within normal limits  URINALYSIS, ROUTINE W REFLEX MICROSCOPIC - Abnormal; Notable for the following components:   Color, Urine STRAW (*)    All other components within normal limits  D-DIMER, QUANTITATIVE - Abnormal; Notable for the following components:   D-Dimer, Quant 0.65 (*)    All other components within normal limits  COMPREHENSIVE METABOLIC PANEL - Abnormal; Notable for the following components:   Sodium 132 (*)  Potassium 3.2 (*)    Chloride 94 (*)    Glucose, Bld 107 (*)    Calcium 8.7 (*)    Total Protein 6.0 (*)    All other components within normal limits  MAGNESIUM - Abnormal; Notable for the following components:   Magnesium 1.6 (*)    All other components within normal limits  LIPASE, BLOOD  TROPONIN I (HIGH SENSITIVITY)  TROPONIN I (HIGH SENSITIVITY)    EKG EKG Interpretation  Date/Time:  Friday Apr 16 2023 10:10:27 EDT Ventricular Rate:  58 PR Interval:  158 QRS Duration: 109 QT Interval:  462 QTC Calculation: 454 R Axis:   29 Text Interpretation: Sinus rhythm Low voltage, precordial leads Abnormal R-wave progression, early transition No significant change was found Confirmed by Glynn Octave [phone]) on 04/16/2023 10:13:02 AM  Radiology CT Angio Chest PE W and/or Wo Contrast  Addendum Date: 04/16/2023   ADDENDUM REPORT: 04/16/2023 15:38 ADDENDUM: There is nodular ground-glass airspace disease within the right upper lobe, not included on the initial findings section. This is likely inflammatory or infectious. Aspiration could give this appearance given history of endoscopy though location is atypical. These results were called by telephone at the time of interpretation on 04/16/2023 at 3:36 pm to provider [facility] , who verbally acknowledged these results. Electronically Signed   By: Sharlet Salina M.D.   On: 04/16/2023 15:38   Result Date:  04/16/2023 CLINICAL DATA:  Abdominal pain after EGD and colonoscopy, positive D-dimer EXAM: CT ANGIOGRAPHY CHEST CT ABDOMEN AND PELVIS WITH CONTRAST TECHNIQUE: Multidetector CT imaging of the chest was performed using the standard protocol during bolus administration of intravenous contrast. Multiplanar CT image reconstructions and MIPs were obtained to evaluate the vascular anatomy. Multidetector CT imaging of the abdomen and pelvis was performed using the standard protocol during bolus administration of intravenous contrast. RADIATION DOSE REDUCTION: This exam was performed according to the departmental dose-optimization program which includes automated exposure control, adjustment of the mA and/or kV according to patient size and/or use of iterative reconstruction technique. CONTRAST:  65mL OMNIPAQUE IOHEXOL 350 MG/ML SOLN COMPARISON:  04/16/2023 FINDINGS: CTA CHEST FINDINGS Cardiovascular: This is a technically adequate evaluation of the pulmonary vasculature. No filling defects or pulmonary emboli. The heart is unremarkable without pericardial effusion. Normal caliber of the thoracic aorta. Mediastinum/Nodes: No enlarged mediastinal, hilar, or axillary lymph nodes. Thyroid gland, trachea, and esophagus demonstrate no significant findings. Specifically, no evidence of complication after recent endoscopy. Lungs/Pleura: No acute airspace disease, effusion, or pneumothorax. Central airways are patent. Musculoskeletal: No acute or destructive bony lesions. Reconstructed images demonstrate no additional findings. Review of the MIP images confirms the above findings. CT ABDOMEN and PELVIS FINDINGS Hepatobiliary: Benign subcapsular cyst posterior right lobe liver. Otherwise liver is unremarkable. Prior cholecystectomy. No biliary duct dilation. Pancreas: Unremarkable. No pancreatic ductal dilatation or surrounding inflammatory changes. Spleen: There is a small low-attenuation subcapsular fluid collection within the  posteromedial aspect of the spleen, measuring up to 7 mm in thickness, which may reflect sequela of previous subcapsular hematoma. No acute splenic parenchymal abnormality. No evidence of splenomegaly. Adrenals/Urinary Tract: There is an exophytic fat attenuation mass arising from the lateral aspect mid right kidney, measuring 3.9 x 2.2 x 3.9 cm, most consistent with renal angiomyolipoma. If not previously evaluated, dedicated nonemergent renal MRI could be considered for characterization and to provided baseline measurements for follow-up. Otherwise the kidneys are unremarkable. No urinary tract calculi or obstructive uropathy within either kidney. The adrenals and bladder are unremarkable. Stomach/Bowel:  No bowel obstruction or ileus. Normal appendix right lower quadrant. Scattered colonic diverticula without evidence of diverticulitis. No bowel wall thickening or inflammatory change. Vascular/Lymphatic: Aortic atherosclerosis. No enlarged abdominal or pelvic lymph nodes. Reproductive: Status post hysterectomy. No adnexal masses. Other: No free fluid or free intraperitoneal gas. No abdominal wall hernia. Musculoskeletal: No acute bony abnormalities. Prior discectomy and posterior fusion at the L4-5 level. Prominent spondylosis at the T12/L1 level. Reconstructed images demonstrate no additional findings. Review of the MIP images confirms the above findings. IMPRESSION: 1. No evidence of pulmonary embolus. 2. No acute intrathoracic, intra-abdominal, or intrapelvic process. Specifically, no evidence of postprocedural complication after endoscopy and colonoscopy. 3. 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 If not previously evaluated, dedicated nonemergent outpatient renal MRI with without contrast may be useful for definitive characterization and to provide a baseline for any follow-up. 4. Small hypoattenuating subcapsular splenic fluid, likely sequela of previous subcapsular  hematoma. No evidence of acute splenic abnormality. 5.  Aortic Atherosclerosis (ICD10-I70.0). Electronically Signed: By: Sharlet Salina M.D. On: 04/16/2023 15:22   CT ABDOMEN PELVIS W CONTRAST  Addendum Date: 04/16/2023   ADDENDUM REPORT: 04/16/2023 15:38 ADDENDUM: There is nodular ground-glass airspace disease within the right upper lobe, not included on the initial findings section. This is likely inflammatory or infectious. Aspiration could give this appearance given history of endoscopy though location is atypical. These results were called by telephone at the time of interpretation on 04/16/2023 at 3:36 pm to provider [facility] , who verbally acknowledged these results. Electronically Signed   By: Sharlet Salina M.D.   On: 04/16/2023 15:38   Result Date: 04/16/2023 CLINICAL DATA:  Abdominal pain after EGD and colonoscopy, positive D-dimer EXAM: CT ANGIOGRAPHY CHEST CT ABDOMEN AND PELVIS WITH CONTRAST TECHNIQUE: Multidetector CT imaging of the chest was performed using the standard protocol during bolus administration of intravenous contrast. Multiplanar CT image reconstructions and MIPs were obtained to evaluate the vascular anatomy. Multidetector CT imaging of the abdomen and pelvis was performed using the standard protocol during bolus administration of intravenous contrast. RADIATION DOSE REDUCTION: This exam was performed according to the departmental dose-optimization program which includes automated exposure control, adjustment of the mA and/or kV according to patient size and/or use of iterative reconstruction technique. CONTRAST:  65mL OMNIPAQUE IOHEXOL 350 MG/ML SOLN COMPARISON:  04/16/2023 FINDINGS: CTA CHEST FINDINGS Cardiovascular: This is a technically adequate evaluation of the pulmonary vasculature. No filling defects or pulmonary emboli. The heart is unremarkable without pericardial effusion. Normal caliber of the thoracic aorta. Mediastinum/Nodes: No enlarged mediastinal, hilar, or  axillary lymph nodes. Thyroid gland, trachea, and esophagus demonstrate no significant findings. Specifically, no evidence of complication after recent endoscopy. Lungs/Pleura: No acute airspace disease, effusion, or pneumothorax. Central airways are patent. Musculoskeletal: No acute or destructive bony lesions. Reconstructed images demonstrate no additional findings. Review of the MIP images confirms the above findings. CT ABDOMEN and PELVIS FINDINGS Hepatobiliary: Benign subcapsular cyst posterior right lobe liver. Otherwise liver is unremarkable. Prior cholecystectomy. No biliary duct dilation. Pancreas: Unremarkable. No pancreatic ductal dilatation or surrounding inflammatory changes. Spleen: There is a small low-attenuation subcapsular fluid collection within the posteromedial aspect of the spleen, measuring up to 7 mm in thickness, which may reflect sequela of previous subcapsular hematoma. No acute splenic parenchymal abnormality. No evidence of splenomegaly. Adrenals/Urinary Tract: There is an exophytic fat attenuation mass arising from the lateral aspect mid right kidney, measuring 3.9 x 2.2 x 3.9 cm, most consistent with renal  angiomyolipoma. If not previously evaluated, dedicated nonemergent renal MRI could be considered for characterization and to provided baseline measurements for follow-up. Otherwise the kidneys are unremarkable. No urinary tract calculi or obstructive uropathy within either kidney. The adrenals and bladder are unremarkable. Stomach/Bowel: No bowel obstruction or ileus. Normal appendix right lower quadrant. Scattered colonic diverticula without evidence of diverticulitis. No bowel wall thickening or inflammatory change. Vascular/Lymphatic: Aortic atherosclerosis. No enlarged abdominal or pelvic lymph nodes. Reproductive: Status post hysterectomy. No adnexal masses. Other: No free fluid or free intraperitoneal gas. No abdominal wall hernia. Musculoskeletal: No acute bony abnormalities.  Prior discectomy and posterior fusion at the L4-5 level. Prominent spondylosis at the T12/L1 level. Reconstructed images demonstrate no additional findings. Review of the MIP images confirms the above findings. IMPRESSION: 1. No evidence of pulmonary embolus. 2. No acute intrathoracic, intra-abdominal, or intrapelvic process. Specifically, no evidence of postprocedural complication after endoscopy and colonoscopy. 3. 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 If not previously evaluated, dedicated nonemergent outpatient renal MRI with without contrast may be useful for definitive characterization and to provide a baseline for any follow-up. 4. Small hypoattenuating subcapsular splenic fluid, likely sequela of previous subcapsular hematoma. No evidence of acute splenic abnormality. 5.  Aortic Atherosclerosis (ICD10-I70.0). Electronically Signed: By: Sharlet Salina M.D. On: 04/16/2023 15:22   CT Head Wo Contrast  Result Date: 04/16/2023 CLINICAL DATA:  62 year old female with dizziness, syncope, pale. EXAM: CT HEAD WITHOUT CONTRAST TECHNIQUE: Contiguous axial images were obtained from the base of the skull through the vertex without intravenous contrast. RADIATION DOSE REDUCTION: This exam was performed according to the departmental dose-optimization program which includes automated exposure control, adjustment of the mA and/or kV according to patient size and/or use of iterative reconstruction technique. COMPARISON:  None Available. FINDINGS: Brain: Cerebral volume is within normal limits for age. No midline shift, ventriculomegaly, mass effect, evidence of mass lesion, intracranial hemorrhage or evidence of cortically based acute infarction. Gray-white matter differentiation is within normal limits throughout the brain. Vascular: No suspicious intracranial vascular hyperdensity. Mild intracranial artery tortuosity (e.g. d istal vertebral arteries and vertebrobasilar  junction). Skull: Negative. Sinuses/Orbits: Visualized paranasal sinuses and mastoids are clear. Other: Visualized orbits and scalp soft tissues are within normal limits. IMPRESSION: Negative noncontrast Head CT; mildly tortuous intracranial arteries. Electronically Signed   By: Odessa Fleming M.D.   On: 04/16/2023 11:23   DG Abdomen Acute W/Chest  Result Date: 04/16/2023 CLINICAL DATA:  Pain after endoscopy EXAM: DG ABDOMEN ACUTE WITH 1 VIEW CHEST COMPARISON:  None Available. FINDINGS: Underinflation. There is some linear opacity along the lung bases likely scar or atelectasis. No consolidation, pneumothorax or effusion. No edema. Normal cardiopericardial silhouette. Overlapping cardiac leads. Minimal bowel gas identified. There is mild gas and stool in the colon. Small amount of air in the stomach. Surgical clips in the right upper quadrant. Fixation hardware along the lower lumbar spine. Overlapping cardiac leads. No obvious free air on the upright view beneath the diaphragm. IMPRESSION: Slight basilar atelectasis. Nonspecific bowel gas pattern with some scattered stool. Minimal bowel gas overall. No obstruction or obvious free air Electronically Signed   By: Karen Kays M.D.   On: 04/16/2023 11:14    Procedures Procedures    Medications Ordered in ED Medications  lactated ringers bolus 1,000 mL (has no administration in time range)    ED Course/ Medical Decision Making/ A&P  Medical Decision Making Amount and/or Complexity of Data Reviewed Independent Historian: spouse and EMS Labs: ordered. Decision-making details documented in ED Course. Radiology: ordered and independent interpretation performed. Decision-making details documented in ED Course. ECG/medicine tests: ordered and independent interpretation performed. Decision-making details documented in ED Course.  Risk Prescription drug management.   Syncopal episode preceded by dizziness, lightheadedness,  nausea, diaphoresis and blurry vision.  No chest pain or shortness of breath.  No head trauma.  Neurologically intact on arrival GCS 15.  EKG is sinus rhythm, no Brugada, no prolonged QT.  Hemoglobin stable at 11.6 orthoStatics are negative.  CT head is stable.  Results reviewed interpreted by me.  Acute abdominal series is negative for free air or bowel obstruction.  Labs show mild hypokalemia and hypomagnesemia.  These are replaced.  Creatinine is normal.  D-dimer is mildly positive.  Troponin negative x 2.  Low suspicion for ACS.  Patient tolerating p.o. and ambulatory.  She is given IV fluids and feels much improved.  Low suspicion for ACS or pulmonary embolism.  Given her nausea as well as abdominal pain after endoscopy will proceed with imaging to rule out complication or free air.  Suspect likely vasovagal syncope.  [place] syncope rules are negative.  CT negative for pulmonary embolism.  Does show groundglass opacities in right upper lobe concerning for possible aspiration.  Discussed with Dr. Manson Passey of radiology.  This to be an atypical location for aspiration but she did undergo endoscopy a week ago.  Patient does admit to a cough.  No hypoxia or increased work of breathing.  No fever.  Will treat with antibiotics.  She is made aware of CT findings and renal mass concerning for angiomyolipoma. Will need MRI for further assessment of this lesion.  She is made aware of the risk of spontaneous hemorrhage.  Admission versus discharge discussed with patient.  She much prefers to go home.  She is agreeable to outpatient workup of her syncope.  Follow-up with her PCP for echocardiogram, carotid Dopplers as well as MRI of her renal cyst.        Final Clinical Impression(s) / ED Diagnoses Final diagnoses:  Syncope and collapse  Renal mass  Hypokalemia    Rx / DC Orders ED Discharge Orders     None         Jovontae Banko, Jeannett Senior, MD 04/16/23 1557

## 2023-04-16 NOTE — Discharge Instructions (Signed)
Your testing today is reassuring.  You declined admission to the hospital for your episode of passing out.  You should follow-up with your primary doctor for an echocardiogram as well as ultrasound of the blood vessels in your neck called a carotid Doppler.  CT scan today did show a area of a cyst on your right kidney.  This needs further evaluation with MRI to ensure there is nothing that needs further intervention.  Could be a fat-containing cyst which can bleed spontaneously and needs to be monitored. Take the potassium supplementation as well as antibiotic as prescribed.  Follow-up with your doctor.  Return to the ED with chest pain, shortness of breath, unable to eat or drink or any other concerns.

## 2023-04-16 NOTE — ED Triage Notes (Signed)
Pt coming from Dr's office via EMS due to feeling dizzy and a syncopal episode. Pt was assisted to the floor, no trauma. Pt is a&ox4, pale and dry to touch. Pt states that she has been experiencing dizziness and tingling to the left side of her head for approx 2 weeks. No neuro def noted. Pt denies cp/shob

## 2023-04-16 NOTE — ED Notes (Signed)
Pt has been ambulatory, taking herself to the restroom. Pt stating that she has been feeling much better and not dizzy.

## 2023-04-23 ENCOUNTER — Encounter: Payer: Self-pay | Admitting: Internal Medicine

## 2023-04-23 ENCOUNTER — Ambulatory Visit: Payer: Managed Care, Other (non HMO)

## 2023-04-23 ENCOUNTER — Ambulatory Visit
Admission: RE | Admit: 2023-04-23 | Discharge: 2023-04-23 | Disposition: A | Discharge status: Home / Self Care | Payer: Managed Care, Other (non HMO) | Source: Ambulatory Visit | Attending: Obstetrics and Gynecology | Admitting: Obstetrics and Gynecology

## 2023-04-23 HISTORY — DX: Benign neoplasm of stomach: D13.1

## 2023-05-06 ENCOUNTER — Encounter: Payer: Self-pay | Admitting: Pulmonary Disease

## 2023-05-06 ENCOUNTER — Ambulatory Visit: Payer: Managed Care, Other (non HMO) | Admitting: Pulmonary Disease

## 2023-05-06 VITALS — BP 110/64 | HR 103 | Ht 62.0 in | Wt 143.0 lb

## 2023-05-06 DIAGNOSIS — R0602 Shortness of breath: Secondary | ICD-10-CM

## 2023-05-06 DIAGNOSIS — R052 Subacute cough: Secondary | ICD-10-CM | POA: Diagnosis not present

## 2023-05-06 MED ORDER — PREDNISONE 20 MG PO TABS: 20.0000 mg | ORAL_TABLET | Freq: Every day | ORAL | 0 refills | Status: AC

## 2023-05-06 MED ORDER — FLUCONAZOLE 100 MG PO TABS: 100.0000 mg | ORAL_TABLET | Freq: Every day | ORAL | 0 refills | Status: AC

## 2023-05-06 MED ORDER — LEVOFLOXACIN 500 MG PO TABS: 500.0000 mg | ORAL_TABLET | Freq: Every day | ORAL | 0 refills | Status: AC

## 2023-05-06 NOTE — Patient Instructions (Addendum)
I will see you in about 3 months  Prescription for prednisone, Levaquin and Diflucan called to pharmacy for you  Call us with any significant concerns

## 2023-05-06 NOTE — Progress Notes (Signed)
Carla Massey    [id number]    July 26, 1961  Primary Care Physician:Golding, Jonny Ruiz, MD  Referring Physician: [facility] [address] [place],  [place] [address]  Chief complaint:   Shortness of breath, cough, some chest discomfort  HPI:  Concern for lung infection  She had had an endoscopy about 3 to 4 weeks ago a few days later she was feeling sick and did pass out Ended up in the emergency department where there was a concern for pneumonia Was treated with a course of antibiotics  Symptoms continue to persist with a cough and lower chest discomfort  She is showing a report that there was atelectasis and we did look at the CT scan together today there is some groundglass change in the right lung  She has no underlying lung disease Never smoker No occupational predisposition  She does have a history of hypertension history of hypercholesterolemia does have allergies She is gallbladder surgery neck and back surgery in the past  Outpatient Encounter Medications as of 05/06/2023  Medication Sig   Calcium Carb-Cholecalciferol (CALCIUM + D3 PO) Take by mouth. TAKE 2 DAILY   escitalopram (LEXAPRO) 5 MG tablet Take 5 mg by mouth daily.   Estradiol 10 MCG TABS vaginal tablet Place 10 mcg vaginally 2 (two) times a week. At bedtime   losartan-hydrochlorothiazide (HYZAAR) 100-25 MG tablet Take 1 tablet by mouth daily.   MAGNESIUM PO Take 1 tablet by mouth daily.   ondansetron (ZOFRAN) 4 MG tablet Take 1 tablet (4 mg total) by mouth every 8 (eight) hours as needed for nausea or vomiting.   pantoprazole (PROTONIX) 40 MG tablet Take 1 tablet (40 mg total) by mouth daily before breakfast.   POTASSIUM PO Take 99 mg by mouth daily.   promethazine (PHENERGAN) 12.5 MG tablet Take 12.5 mg by mouth every 8 (eight) hours as needed.   zolpidem (AMBIEN) 10 MG tablet Take 5 mg by mouth at bedtime as needed for sleep.   [DISCONTINUED] amoxicillin-clavulanate  (AUGMENTIN) 875-125 MG tablet Take 1 tablet by mouth every 12 (twelve) hours.   [DISCONTINUED] potassium chloride SA (KLOR-CON M) 20 MEQ tablet Take 1 tablet (20 mEq total) by mouth 2 (two) times daily.   No facility-administered encounter medications on file as of 05/06/2023.    Allergies as of 05/06/2023 - Review Complete 05/06/2023  Allergen Reaction Noted   Ketoconazole Hives 05/06/2015    Past Medical History:  Diagnosis Date   Allergic rhinitis    Allergy    SEASONAL   Anemia    Anxiety    Arthritis    BACK,SHOULDERS   Depression    history   Esophageal reflux    Fundic gland polyps of stomach, benign    Headache    Hiatal hernia    Hypertension    Insomnia    Motility disorder, esophageal    Muscle weakness    Seasonal allergies    SVD (spontaneous vaginal delivery)    x 1    Past Surgical History:  Procedure Laterality Date   BILATERAL SALPINGECTOMY Bilateral 05/21/2015   Procedure: BILATERAL SALPINGECTOMY;  Surgeon: Waynard Reeds, MD;  Location: [facility];  Service: Gynecology;  Laterality: Bilateral;   CHOLECYSTECTOMY N/A 12/24/2022   Procedure: LAPAROSCOPIC CHOLECYSTECTOMY;  Surgeon: Gaynelle Adu, MD;  Location: [facility];  Service: General;  Laterality: N/A;   COLONOSCOPY     LAPAROSCOPIC ASSISTED VAGINAL HYSTERECTOMY N/A 05/21/2015   Procedure: LAPAROSCOPIC  ASSISTED VAGINAL HYSTERECTOMY;  Surgeon: Waynard Reeds, MD;  Location: [facility];  Service: Gynecology;  Laterality: N/A;   LUMBAR DISC SURGERY  06/11/2021   L4-L5   UPPER GASTROINTESTINAL ENDOSCOPY     UPPER GI ENDOSCOPY     WISDOM TOOTH EXTRACTION      Family History  Problem Relation Age of Onset   Colon polyps Mother    Thyroid cancer Mother    Hypertension Mother    Breast cancer Mother 72   Hypertension Father    Colon polyps Brother    BRCA 1/2 Neg Hx    Colon cancer Neg Hx    Crohn's disease Neg Hx    Esophageal cancer Neg Hx    Rectal cancer Neg Hx    Stomach cancer Neg Hx    Ulcerative  colitis Neg Hx     Social History   Socioeconomic History   Marital status: Married    Spouse name: Not on file   Number of children: 1   Years of education: HS   Highest education level: Not on file  Occupational History   Occupation: Surveyor, minerals: [employer]    Comment: Dental Practice  Tobacco Use   Smoking status: Never   Smokeless tobacco: Never  Vaping Use   Vaping Use: Never used  Substance and Sexual Activity   Alcohol use: No    Alcohol/week: 0.0 standard drinks of alcohol   Drug use: No   Sexual activity: Yes  Other Topics Concern   Not on file  Social History Narrative   Lives at home with husband.   Right-handed.   16oz caffeine daily.   Social Determinants of Health   Financial Resource Strain: Not on file  Food Insecurity: Not on file  Transportation Needs: Not on file  Physical Activity: Not on file  Stress: Not on file  Social Connections: Not on file  Intimate Partner Violence: Not on file    Review of Systems  Respiratory:  Positive for cough and shortness of breath.   Psychiatric/Behavioral:  Negative for sleep disturbance.     Vitals:   05/06/23 1405  BP: 110/64  Pulse: (!) 103  SpO2: 99%     Physical Exam Constitutional:      Appearance: Normal appearance.  HENT:     Head: Normocephalic.     Mouth/Throat:     Mouth: Mucous membranes are moist.  Eyes:     General: No scleral icterus. Cardiovascular:     Rate and Rhythm: Normal rate and regular rhythm.     Heart sounds: No murmur heard.    No friction rub.  Pulmonary:     Effort: No respiratory distress.     Breath sounds: No stridor. No wheezing or rhonchi.  Musculoskeletal:     Cervical back: No rigidity or tenderness.  Neurological:     Mental Status: She is alert.  Psychiatric:        Mood and Affect: Mood normal.    Data Reviewed: CT scan of the chest was reviewed with the patient in the office today  Assessment:  Right lower lobe  pneumonia that was treated with a course of antibiotics -Symptoms did not completely resolve continues to have some chest discomfort and persistent cough  This followed recent endoscopy -She had concerns about possible aspiration  Has no underlying lung disease known to her with no occupational predisposition to lung disease  Plan/Recommendations: I will give her a course of antibiotics-Levaquin, steroids,  Diflucan as she has had yeast infections with antibiotics previously  I will see you in about 3 months  No follow-up testing needs done at present  Encouraged to call with significant concerns   Virl Diamond MD [facility] 05/06/2023, 2:27 PM  CC: [facility]*

## 2023-05-20 ENCOUNTER — Other Ambulatory Visit: Payer: Self-pay | Admitting: Pulmonary Disease

## 2023-06-10 ENCOUNTER — Encounter: Payer: Self-pay | Admitting: Interventional Cardiology

## 2023-06-10 ENCOUNTER — Ambulatory Visit: Payer: Managed Care, Other (non HMO) | Attending: Cardiology | Admitting: Interventional Cardiology

## 2023-06-10 VITALS — BP 120/76 | HR 109 | Ht 62.0 in | Wt 145.0 lb

## 2023-06-10 DIAGNOSIS — R55 Syncope and collapse: Secondary | ICD-10-CM

## 2023-06-10 DIAGNOSIS — R0609 Other forms of dyspnea: Secondary | ICD-10-CM | POA: Diagnosis not present

## 2023-06-10 NOTE — Progress Notes (Signed)
Cardiology Office Note   Date:  06/10/2023   ID:  Carla Massey, DOB 30-Aug-1961, MRN [id number]  PCP:  Assunta Found, MD    No chief complaint on file.  DOE  [employer] Readings from Last 3 Encounters:  06/10/23 145 lb (65.8 kg)  05/06/23 143 lb (64.9 kg)  04/16/23 140 lb (63.5 kg)       History of Present Illness: Carla Massey is a 62 y.o. female who is being seen today for the evaluation of syncope at the request of Assunta Found, MD.   Had cholecystectomy in Jan 2024 and has not been the same since.  Appetite has improved and nausea decreased. Nausea.  Went to ER in 04/2023 for: "with dizziness and syncope. She was assisted to the floor, no fall, no trauma. "    She works at the [employer] development [employer].  She had been feeling poorly for 5 days prior. "with a history of nausea, hiatal hernia, esophageal reflux, hypertension presenting with near syncopal episode.  States she was going to her mammogram this morning when she had a near syncopal episode in the waiting room.  She felt well prior to this.  She ate crackers for breakfast that she normally does.  While sitting in the waiting room she began to feel lightheaded, had blurry vision, nausea, sweating.  She was lowered to the ground by staff and not hit her head.  Remembers waking up on the ground.  No tongue biting or incontinence.  No chest pain or shortness of breath.  Did have brief tingling to both hands after this episode which is now resolved.  Does improve now.  Blood sugar at the time was 129 per EMS. No room spinning dizziness."  Left sided tingling.  Left leg pain.  She feels anxious.    Past Medical History:  Diagnosis Date   Allergic rhinitis    Allergy    SEASONAL   Anemia    Anxiety    Arthritis    BACK,SHOULDERS   Depression    history   Esophageal reflux    Fundic gland polyps of stomach, benign    Headache    Hiatal hernia    Hypertension    Insomnia    Motility disorder, esophageal    Muscle  weakness    Seasonal allergies    SVD (spontaneous vaginal delivery)    x 1    Past Surgical History:  Procedure Laterality Date   BILATERAL SALPINGECTOMY Bilateral 05/21/2015   Procedure: BILATERAL SALPINGECTOMY;  Surgeon: Waynard Reeds, MD;  Location: [facility];  Service: Gynecology;  Laterality: Bilateral;   CHOLECYSTECTOMY N/A 12/24/2022   Procedure: LAPAROSCOPIC CHOLECYSTECTOMY;  Surgeon: Gaynelle Adu, MD;  Location: [facility];  Service: General;  Laterality: N/A;   COLONOSCOPY     LAPAROSCOPIC ASSISTED VAGINAL HYSTERECTOMY N/A 05/21/2015   Procedure: LAPAROSCOPIC ASSISTED VAGINAL HYSTERECTOMY;  Surgeon: Waynard Reeds, MD;  Location: [facility];  Service: Gynecology;  Laterality: N/A;   LUMBAR DISC SURGERY  06/11/2021   L4-L5   UPPER GASTROINTESTINAL ENDOSCOPY     UPPER GI ENDOSCOPY     WISDOM TOOTH EXTRACTION       Current Outpatient Medications  Medication Sig Dispense Refill   Calcium Carb-Cholecalciferol (CALCIUM + D3 PO) Take by mouth. TAKE 2 DAILY     escitalopram (LEXAPRO) 5 MG tablet Take 5 mg by mouth daily.     Estradiol 10 MCG TABS vaginal tablet Place 10 mcg vaginally 2 (two)  times a week. At bedtime     levofloxacin (LEVAQUIN) 500 MG tablet Take 1 tablet (500 mg total) by mouth daily. 7 tablet 0   losartan-hydrochlorothiazide (HYZAAR) 100-25 MG tablet Take 1 tablet by mouth daily.     MAGNESIUM PO Take 1 tablet by mouth daily.     ondansetron (ZOFRAN) 4 MG tablet Take 1 tablet (4 mg total) by mouth every 8 (eight) hours as needed for nausea or vomiting. 50 tablet 0   pantoprazole (PROTONIX) 40 MG tablet Take 1 tablet (40 mg total) by mouth daily before breakfast. 30 tablet 11   POTASSIUM PO Take 99 mg by mouth daily.     predniSONE (DELTASONE) 20 MG tablet Take 1 tablet (20 mg total) by mouth daily with breakfast. 7 tablet 0   promethazine (PHENERGAN) 12.5 MG tablet Take 12.5 mg by mouth every 8 (eight) hours as needed.     zolpidem (AMBIEN) 10 MG tablet Take 5 mg by mouth at  bedtime as needed for sleep.     No current facility-administered medications for this visit.    Allergies:   Ketoconazole    Social History:  The patient  reports that she has never smoked. She has never used smokeless tobacco. She reports that she does not drink alcohol and does not use drugs.   Family History:  The patient's family history includes Breast cancer (age of onset: 7) in her mother; Colon polyps in her brother and mother; Hypertension in her father and mother; Thyroid cancer in her mother.    ROS:  Please see the history of present illness.   Otherwise, review of systems are positive for leg swelling, DOE.   All other systems are reviewed and negative.    PHYSICAL EXAM: VS:  BP 120/76   Pulse (!) 109   Ht 5\' 2"  (1.575 m)   Wt 145 lb (65.8 kg)   LMP 05/15/2015 Comment: bleeding  SpO2 97%   BMI 26.52 kg/m  , BMI Body mass index is 26.52 kg/m. GEN: Well nourished, well developed, in no acute distress HEENT: normal Neck: no JVD, carotid bruits, or masses Cardiac: RRR; no murmurs, rubs, or gallops,no edema  Respiratory:  clear to auscultation bilaterally, normal work of breathing GI: soft, nontender, nondistended, + BS MS: no deformity or atrophy; left thigh pain to palpation, no swelling, both lower extremities are the same size Skin: warm and dry, no rash Neuro:  Strength and sensation are intact Psych: euthymic mood, full affect   EKG:   The ekg ordered 04/2023 demonstrates NSR, no ST changes   Recent Labs: 04/16/2023: ALT 21; BUN 7; Creatinine, Ser 0.62; Hemoglobin 11.6; Magnesium 1.6; Platelets 250; Potassium 3.2; Sodium 132   Lipid Panel No results found for: "CHOL", "TRIG", "HDL", "CHOLHDL", "VLDL", "LDLCALC", "LDLDIRECT"   Other studies Reviewed: Additional studies/ records that were reviewed today with results demonstrating: .  Labs reviewed.  Chest CT showed: "This is a technically adequate evaluation of the pulmonary vasculature. No filling  defects or pulmonary emboli.   The heart is unremarkable without pericardial effusion. Normal caliber of the thoracic aorta."   ASSESSMENT AND PLAN:  Syncope: Plan for echo.  Stay well-hydrated.  Doubt severe structural heart disease based on her exam.  No significant arrhythmias noted. DOE: Appears euvolemic.  Likely some deconditioning as her exercise has dropped off due to her cholecystectomy that she had in January.  She wants to get back into regular exercise. Screening test for coronary artery disease would be  a calcium scoring CT Leg pain: No sign of DVT.  I explained to her that the positive D-dimer is not always an accurate test for DVT.   Current medicines are reviewed at length with the patient today.  The patient concerns regarding her medicines were addressed.  The following changes have been made:  No change  Labs/ tests ordered today include: As above No orders of the defined types were placed in this encounter.   Recommend 150 minutes/week of aerobic exercise Low fat, low carb, high fiber diet recommended  Disposition:   FU in as needed   Signed, Lance Muss, MD  06/10/2023 2:36 PM    [facility] [address] [place], [place], [place]  [address] Phone: [phone]; Fax: [phone]

## 2023-06-10 NOTE — Patient Instructions (Signed)
Medication Instructions:  Your physician recommends that you continue on your current medications as directed. Please refer to the Current Medication list given to you today.  *If you need a refill on your cardiac medications before your next appointment, please call your pharmacy*   Lab Work: none If you have labs (blood work) drawn today and your tests are completely normal, you will receive your results only by: MyChart Message (if you have MyChart) OR A paper copy in the mail If you have any lab test that is abnormal or we need to change your treatment, we will call you to review the results.   Testing/Procedures: Your physician has requested that you have an echocardiogram. Echocardiography is a painless test that uses sound waves to create images of your heart. It provides your doctor with information about the size and shape of your heart and how well your heart's chambers and valves are working. This procedure takes approximately one hour. There are no restrictions for this procedure. Please do NOT wear cologne, perfume, aftershave, or lotions (deodorant is allowed). Please arrive 15 minutes prior to your appointment time.  Dr Varanasi recommends you have a Calcium Score CT scan   Follow-Up: At [facility], you and your health needs are our priority.  As part of our continuing mission to provide you with exceptional heart care, we have created designated Provider Care Teams.  These Care Teams include your primary Cardiologist (physician) and Advanced Practice Providers (APPs -  Physician Assistants and Nurse Practitioners) who all work together to provide you with the care you need, when you need it.  We recommend signing up for the patient portal called "MyChart".  Sign up information is provided on this After Visit Summary.  MyChart is used to connect with patients for Virtual Visits (Telemedicine).  Patients are able to view lab/test results, encounter notes, upcoming  appointments, etc.  Non-urgent messages can be sent to your provider as well.   To learn more about what you can do with MyChart, go to [email or web address].    Your next appointment:   As needed  Provider:   Jayadeep Varanasi, MD     Other Instructions    

## 2023-06-11 ENCOUNTER — Ambulatory Visit: Payer: Managed Care, Other (non HMO) | Admitting: Cardiology

## 2023-06-16 ENCOUNTER — Ambulatory Visit (HOSPITAL_COMMUNITY)
Admission: RE | Admit: 2023-06-16 | Discharge: 2023-06-16 | Disposition: A | Discharge status: Home / Self Care | Payer: Self-pay | Source: Ambulatory Visit | Attending: Interventional Cardiology | Admitting: Interventional Cardiology

## 2023-06-16 ENCOUNTER — Ambulatory Visit (HOSPITAL_COMMUNITY): Payer: Managed Care, Other (non HMO) | Attending: Interventional Cardiology

## 2023-06-16 DIAGNOSIS — R0609 Other forms of dyspnea: Secondary | ICD-10-CM | POA: Diagnosis present

## 2023-06-16 DIAGNOSIS — R55 Syncope and collapse: Secondary | ICD-10-CM | POA: Diagnosis present

## 2023-06-16 LAB — ECHOCARDIOGRAM COMPLETE
Area-P 1/2: 3.85 cm2
S' Lateral: 2.4 cm

## 2023-07-02 ENCOUNTER — Ambulatory Visit (HOSPITAL_COMMUNITY): Payer: Managed Care, Other (non HMO)

## 2023-07-08 ENCOUNTER — Other Ambulatory Visit (HOSPITAL_COMMUNITY): Payer: Managed Care, Other (non HMO)

## 2023-09-10 ENCOUNTER — Ambulatory Visit: Payer: Managed Care, Other (non HMO) | Admitting: Pulmonary Disease

## 2023-09-21 ENCOUNTER — Other Ambulatory Visit: Payer: Self-pay | Admitting: Physician Assistant

## 2023-10-01 ENCOUNTER — Other Ambulatory Visit: Payer: Self-pay | Admitting: Physician Assistant

## 2023-10-12 ENCOUNTER — Other Ambulatory Visit: Payer: Self-pay | Admitting: Physician Assistant

## 2023-12-10 ENCOUNTER — Encounter: Payer: Self-pay | Admitting: Internal Medicine

## 2023-12-13 MED ORDER — OMEPRAZOLE 40 MG PO CPDR: 40.0000 mg | DELAYED_RELEASE_CAPSULE | Freq: Every day | ORAL | 3 refills | Status: DC

## 2024-02-05 ENCOUNTER — Other Ambulatory Visit: Payer: Self-pay | Admitting: Obstetrics and Gynecology

## 2024-02-05 DIAGNOSIS — N644 Mastodynia: Secondary | ICD-10-CM

## 2024-02-08 ENCOUNTER — Ambulatory Visit: Payer: Managed Care, Other (non HMO)

## 2024-02-08 ENCOUNTER — Ambulatory Visit
Admission: RE | Admit: 2024-02-08 | Discharge: 2024-02-08 | Disposition: A | Discharge status: Home / Self Care | Payer: Managed Care, Other (non HMO) | Source: Ambulatory Visit | Attending: Obstetrics and Gynecology | Admitting: Obstetrics and Gynecology

## 2024-02-08 DIAGNOSIS — N644 Mastodynia: Secondary | ICD-10-CM

## 2024-03-21 ENCOUNTER — Other Ambulatory Visit: Payer: Self-pay | Admitting: Obstetrics and Gynecology

## 2024-03-21 DIAGNOSIS — Z1231 Encounter for screening mammogram for malignant neoplasm of breast: Secondary | ICD-10-CM

## 2024-04-21 ENCOUNTER — Encounter (HOSPITAL_COMMUNITY): Payer: Self-pay

## 2024-04-28 ENCOUNTER — Ambulatory Visit
Admission: RE | Admit: 2024-04-28 | Discharge: 2024-04-28 | Disposition: A | Discharge status: Home / Self Care | Source: Ambulatory Visit | Attending: Obstetrics and Gynecology | Admitting: Obstetrics and Gynecology

## 2024-04-28 DIAGNOSIS — Z1231 Encounter for screening mammogram for malignant neoplasm of breast: Secondary | ICD-10-CM

## 2024-11-21 ENCOUNTER — Other Ambulatory Visit: Payer: Self-pay | Admitting: Internal Medicine
